# Patient Record
Sex: Female | Born: 1980 | Race: White | Hispanic: No | Marital: Married | Smoking: Former smoker
Health system: Southern US, Community
[De-identification: ages and names within clinical notes are randomized; demographics above are authoritative.]

## PROBLEM LIST (undated history)

## (undated) DIAGNOSIS — Z332 Encounter for elective termination of pregnancy: Secondary | ICD-10-CM

## (undated) DIAGNOSIS — R5383 Other fatigue: Secondary | ICD-10-CM

## (undated) DIAGNOSIS — R002 Palpitations: Secondary | ICD-10-CM

## (undated) DIAGNOSIS — M797 Fibromyalgia: Secondary | ICD-10-CM

## (undated) DIAGNOSIS — Z8719 Personal history of other diseases of the digestive system: Secondary | ICD-10-CM

## (undated) HISTORY — DX: Palpitations: R00.2

## (undated) HISTORY — PX: DILATION AND CURETTAGE OF UTERUS: SHX78

## (undated) HISTORY — PX: WISDOM TOOTH EXTRACTION: SHX21

## (undated) HISTORY — DX: Fibromyalgia: M79.7

## (undated) HISTORY — DX: Other fatigue: R53.83

---

## 2007-02-21 ENCOUNTER — Emergency Department (HOSPITAL_COMMUNITY): Admission: EM | Admit: 2007-02-21 | Discharge: 2007-02-21 | Payer: Self-pay | Admitting: Emergency Medicine

## 2008-01-08 ENCOUNTER — Emergency Department (HOSPITAL_COMMUNITY): Admission: EM | Admit: 2008-01-08 | Discharge: 2008-01-09 | Payer: Self-pay | Admitting: Emergency Medicine

## 2009-08-21 ENCOUNTER — Emergency Department (HOSPITAL_COMMUNITY): Admission: EM | Admit: 2009-08-21 | Discharge: 2009-08-21 | Payer: Self-pay | Admitting: Emergency Medicine

## 2009-11-06 IMAGING — US US PELVIS COMPLETE MODIFY
1 series · 14 of 25 positions shown · non-contrast
Comparison: None

CLINICAL DATA: Abdominal and pelvic pain. Gravida 1, para 0.
Negative urine pregnancy test. LMP 12/25/2007.

TRANSABDOMINAL AND TRANSVAGINAL ULTRASOUND OF PELVIS
TECHNIQUE: Both transabdominal and transvaginal ultrasound
examinations of the pelvis were performed including evaluation of
the uterus, ovaries, adnexal regions, and pelvic cul-de-sac.

[Series 1: unknown · 0.28mm/px · 14 of 55 slices shown]
[im 1/55]
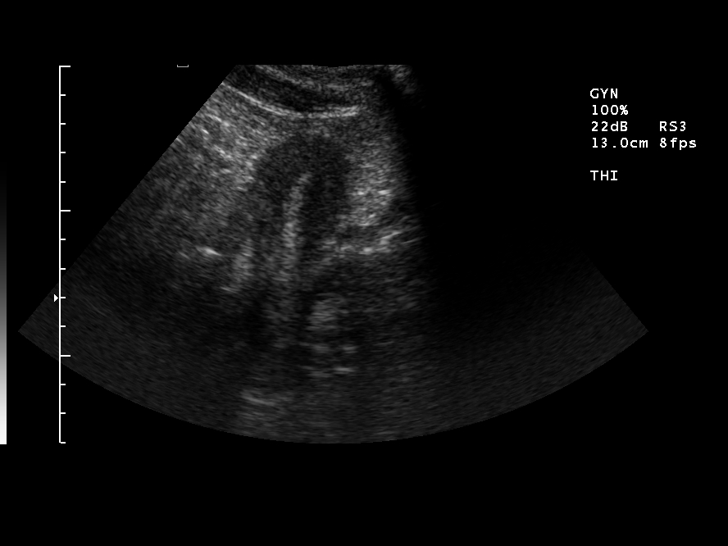
[im 5/55]
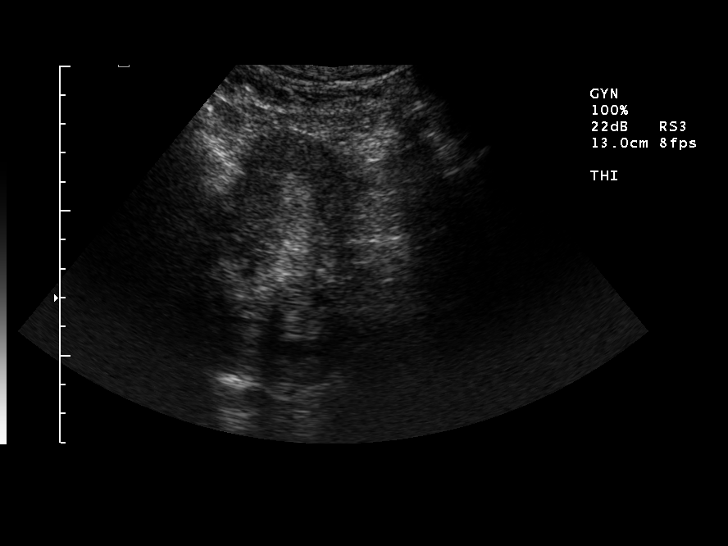
[im 10/55]
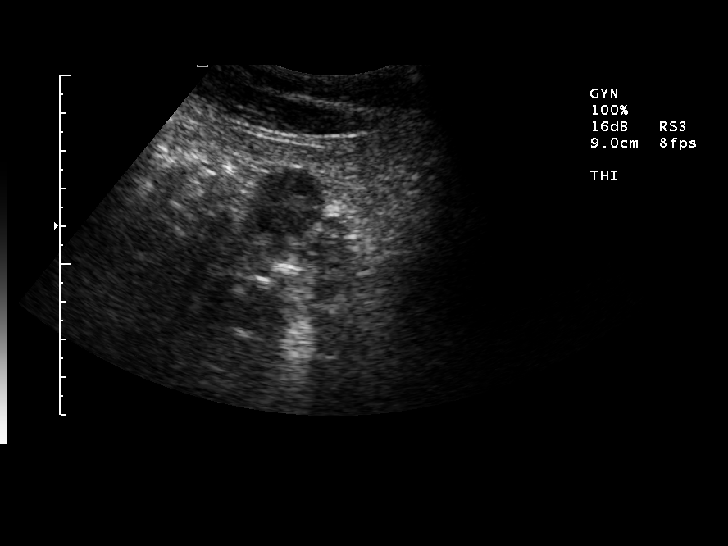
[im 14/55]
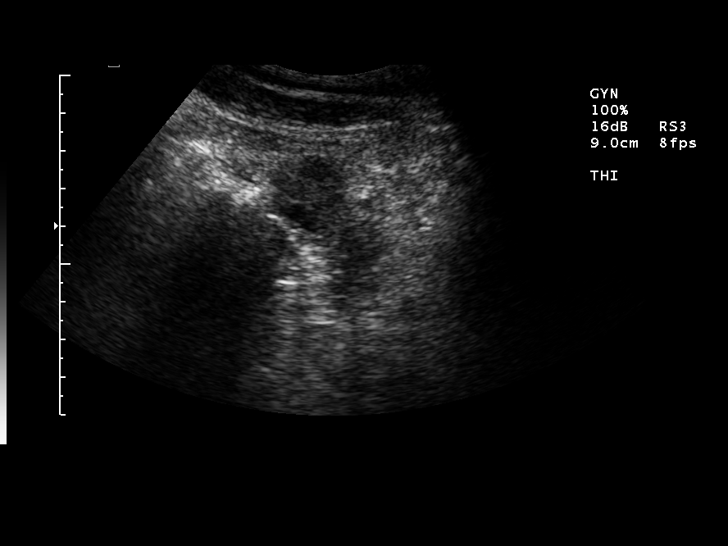
[im 19/55]
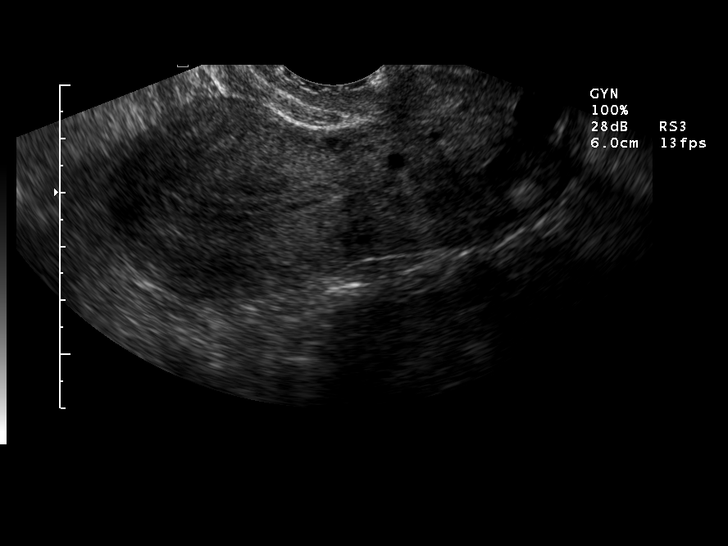
[im 21/55]
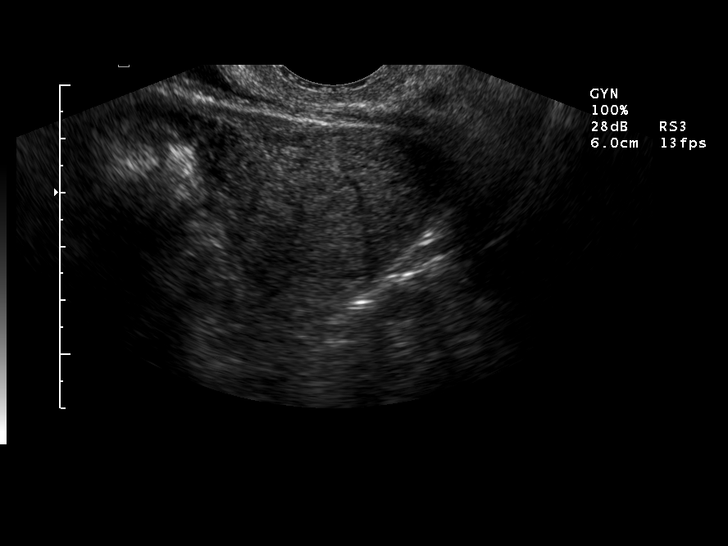
[im 25/55]
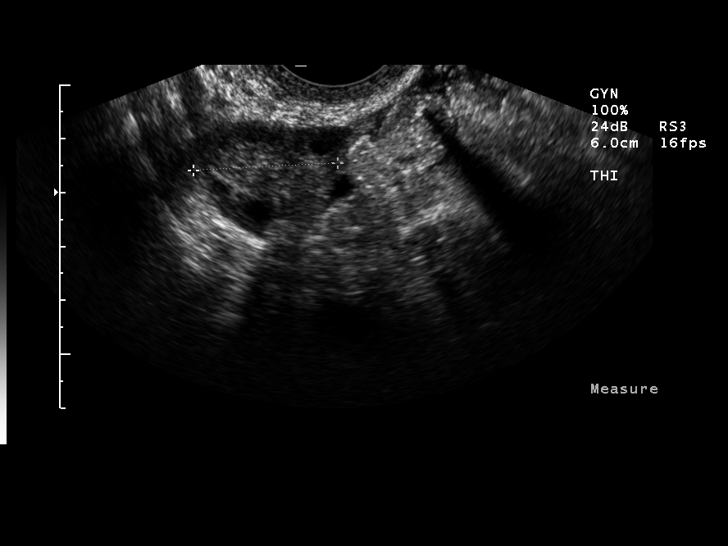
[im 30/55]
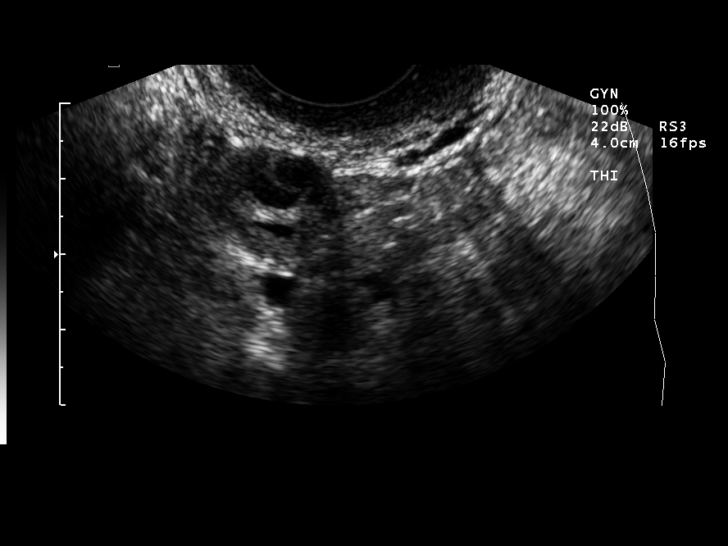
[im 34/55]
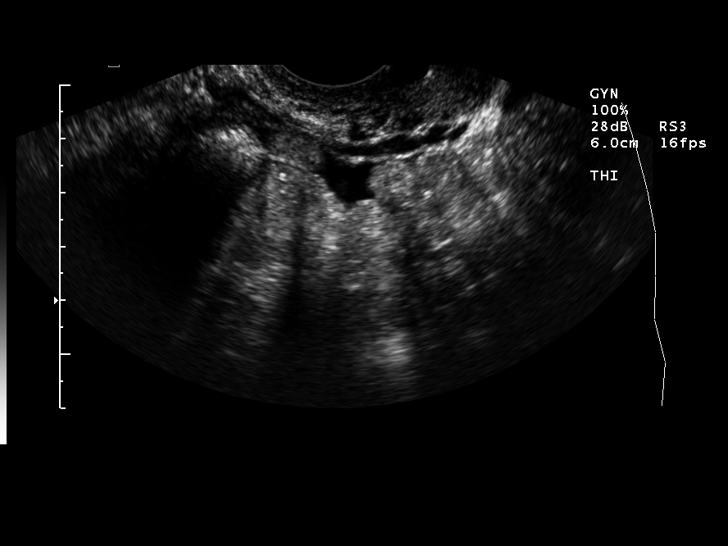
[im 37/55]
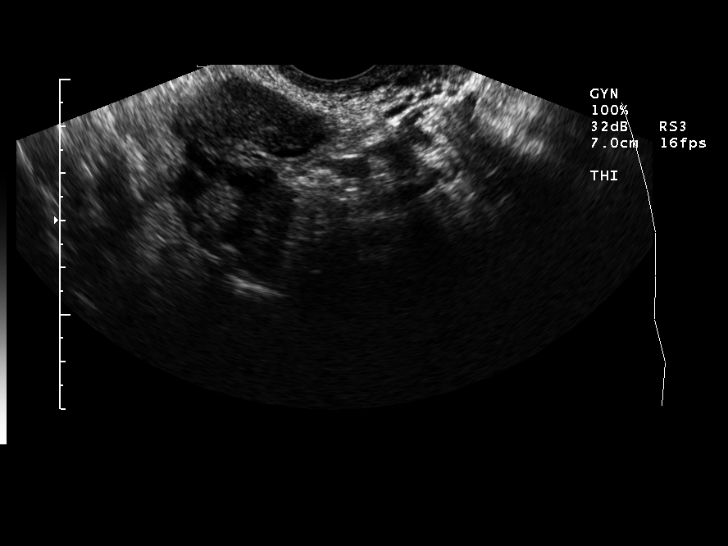
[im 41/55]
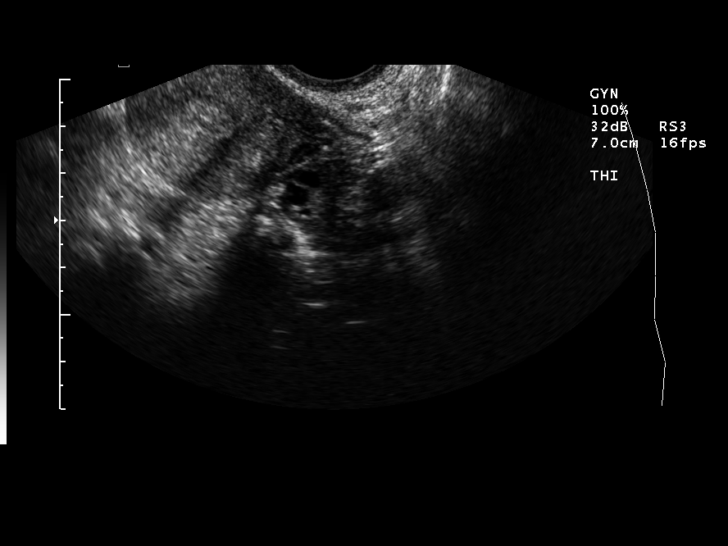
[im 46/55]
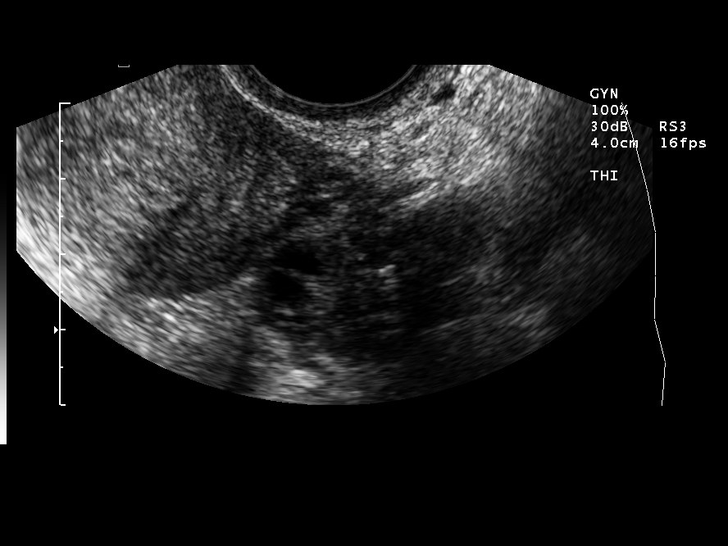
[im 50/55]
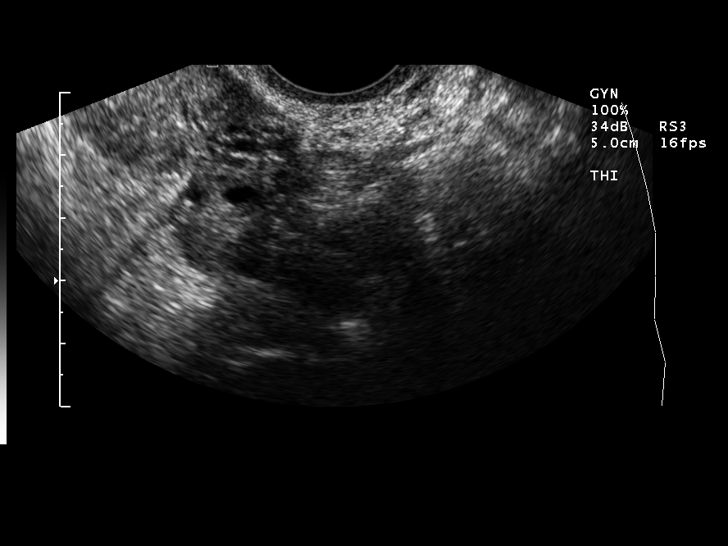
[im 55/55]
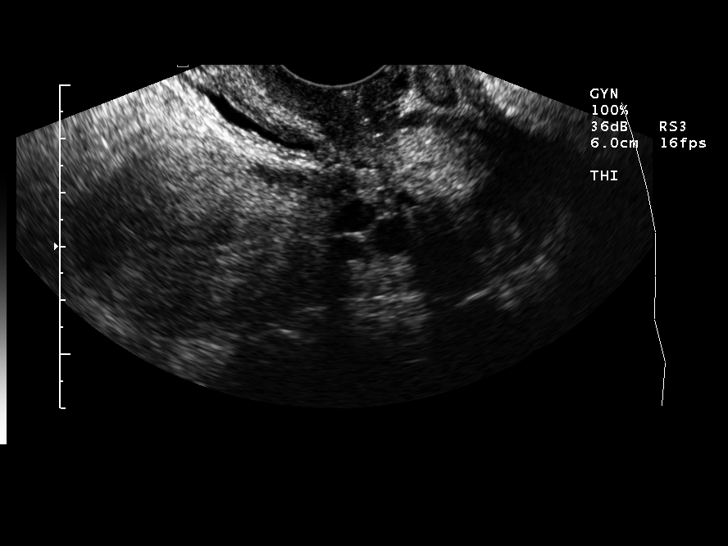

[14 of 25 positions shown; findings below may reference images not displayed]

FINDINGS: Uterus is 8.9 x 3.6 x 4.4 cm.  The endometrial stripe is
normal in appearance and measures 7 mm. Multiple Nabothian cysts
are identified.

Right ovary is 3.2 x 1.9 x 2.7 cm.  Left ovary is 3.2 x 2.5 x
cm.  The ovaries have normal appearance bilaterally.  Small amount
free pelvic fluid is identified, likely physiologic.
IMPRESSION: Normal pelvic ultrasound. I discussed findings with Dr. Barang.

## 2010-09-07 ENCOUNTER — Inpatient Hospital Stay (HOSPITAL_COMMUNITY)
Admission: AD | Admit: 2010-09-07 | Discharge: 2010-09-10 | DRG: 371 | Disposition: A | Discharge status: Home / Self Care | Payer: BC Managed Care – PPO | Source: Ambulatory Visit | Attending: Obstetrics and Gynecology | Admitting: Obstetrics and Gynecology

## 2010-09-07 DIAGNOSIS — O321XX0 Maternal care for breech presentation, not applicable or unspecified: Principal | ICD-10-CM | POA: Diagnosis present

## 2010-09-07 DIAGNOSIS — O4100X0 Oligohydramnios, unspecified trimester, not applicable or unspecified: Secondary | ICD-10-CM | POA: Diagnosis present

## 2010-09-07 LAB — ABO/RH: ABO/RH(D): O NEG

## 2010-09-07 LAB — CBC
MCHC: 35.1 g/dL (ref 30.0–36.0)
RBC: 4.11 MIL/uL (ref 3.87–5.11)

## 2010-09-08 LAB — CBC
Hemoglobin: 10.9 g/dL — ABNORMAL LOW (ref 12.0–15.0)
MCHC: 34.2 g/dL (ref 30.0–36.0)
Platelets: 150 10*3/uL (ref 150–400)
RBC: 3.42 MIL/uL — ABNORMAL LOW (ref 3.87–5.11)
RDW: 13.4 % (ref 11.5–15.5)

## 2010-09-09 ENCOUNTER — Encounter (HOSPITAL_COMMUNITY): Payer: BC Managed Care – PPO | Attending: Obstetrics and Gynecology

## 2010-09-09 DIAGNOSIS — Z01818 Encounter for other preprocedural examination: Secondary | ICD-10-CM | POA: Insufficient documentation

## 2010-09-09 LAB — RH IMMUNE GLOB WKUP(>/=20WKS)(NOT WOMEN'S HOSP): Unit division: 0

## 2010-09-12 NOTE — Discharge Summary (Addendum)
  Tina [name], Tina [name]              ACCOUNT NO.:  [id number]  MEDICAL RECORD NO.:  [id number]           PATIENT TYPE:  I  LOCATION:  9132                          [facility]ACILITY:  [facility]  PHYSICIAN:  Maxie Better, M.D.DATE OF BIRTH:  [birth year]  DATE OF ADMISSION:  09/06/2010 DATE OF DISCHARGE:  09/10/2010                              DISCHARGE SUMMARY   ADMISSION DIAGNOSES: 1. Term intrauterine pregnancy in frank breech position. 2. Oligohydramnios. 3. [name] breech presentation  DISCHARGE DIAGNOSIS:  Status post cesarean section and delivery of viable female infant. 2) [name] breech presentation 3) Oligohydramnios  The patient is a G2, P 0-0-1-0 with an EDC of September 17, 2010.  She has received prenatal care at [facility] since 11 weeks' gestation with Dr. Cherly Hensen as primary provider.  PRENATAL LABS:  Rh positive, rubella immune, GBS positive, HIV nonreactive, RPR nonreactive, hepatitis B nonreactive, 1-hour GTT 98. Prenatal course has essentially been uneventful.  ALLERGIES: 1. SHELLFISH. 2. PENICILLIN. 3. LATEX.  CURRENT MEDICATIONS:  Prenatal vitamin 1 tablet p.o. q. day.  PREOP LABS:  On September 07, 2010, WBC 14.1, hemoglobin 13.3, hematocrit 37.9, and platelets 168.  A viable female infant was delivered by C- section on September 07, 2010, by Dr. Cherly Hensen due to oligohydramnios and breech position.  Infant weighed 6 pounds 6 ounces with Apgars of 8 and 9, and was transferred to regular newborn nursery in stable condition. Postop labs on September 08, 2010, include WBC 15.7, hemoglobin 10.9, hematocrit 31.9, and platelets 150K.  Postop course has been uneventful and the patient has progressed well. Vital signs were stable.  Abdominal incision is closed with staples and appears clean, dry, and well approximated.  We will remove staples today and apply Benzoin and Steri-Strips prior to discharge.  Newborn female infant is successfully breast-feeding.  The patient is up ad  lib, tolerating a regular diet, and afebrile at the time of discharge.  She has been given the [facility] postpartum booklet.  PRESCRIPTIONS: 1. Ibuprofen 600 mg p.o. q.6 h. p.r.n. pain, dispensed #30, no refill. 2. Percocet 5/325 one to two p.o. q.6 h. p.r.n. pain, dispensed #30,     no refill.  The patient has been advised to follow up for routine postpartum visit in 6 weeks.     Arlana Lindau, NP   ______________________________ Maxie Better, M.D.    JF/MEDQ  D:  09/10/2010  T:  09/11/2010  Job:  [id number]  Electronically Signed by Nena Jordan COUSINS M.D. on 09/11/2010 02:05:08 AM Electronically Signed by Arlana Lindau  on 09/27/2010 08:38:48 AM

## 2010-09-12 NOTE — Op Note (Signed)
[name], [name]              ACCOUNT NO.:  [id number]  MEDICAL RECORD NO.:  [id number]           PATIENT TYPE:  I  LOCATION:  9132                          [facility]ILITY:  [facility]  PHYSICIAN:  Maxie Better, M.D.DATE OF BIRTH:  29-Mar-1981  DATE OF PROCEDURE:  09/07/2010 DATE OF DISCHARGE:                              OPERATIVE REPORT   PREOPERATIVE DIAGNOSES:  Oligohydramnios, frank breech presentation, and intrauterine gestation at 38-3/7 weeks.  PROCEDURE:  Primary cesarean section, Kerr hysterotomy.  POSTOPERATIVE DIAGNOSES:  [name] breech presentation, oligohydramnios, and intrauterine gestation at 38-3/7 weeks.  ANESTHESIA:  Spinal.  SURGEON:  Maxie Better, M.D.  ASSISTANT:  Marlinda Mike, CNM.  PROCEDURE:  Under adequate spinal anesthesia, the patient was placed in supine position with a left lateral tilt.  She was sterilely prepped and draped in usual fashion.  Indwelling Foley catheter was sterilely placed.  Marcaine 0.25% was injected along the planned Pfannenstiel skin incision site.  Pfannenstiel skin incision was then made, carried down to the rectus fascia.  The rectus fascia was opened transversely.  The rectus fascia was then bluntly and sharply dissected off the rectus muscle in superior and inferior fashion.  The rectus muscle was split in midline.  The parietal peritoneum was entered sharply and extended.  The vesicouterine peritoneum was opened transversely.  There were very prominent vessels in the lower uterine segment.  A curvilinear low transverse uterine incision was then made and extended with bandage scissors.  Artificial rupture of membranes subsequently occurred with clear fluid noted, but scant.  Subsequent delivery of a live female from a frank breech position was accomplished with the use of usual breech maneuvers.  Baby was bulb suctioned on the abdomen.  Cord was clamped and cut.  The baby was transferred to the awaiting pediatrician  who assigned Apgars of 8 and 9 at one and five minutes.  The placenta which was anterior was partially and spontaneously removed intact, but not sent to pathology.  Uterine cavity was visually explored and manually explored.  No evidence of intrauterine anomaly noted.  Uterus was exteriorized, inspected as well.  No uterine anomaly was seen.  Normal tubes and ovaries were noted bilaterally.  The uterine incision was cleaned of debris.  The incision was closed with 0-Monocryl running locked stitch, first layer.  Uterus was returned to the abdomen.  The second layer was imbricating 0-Monocryl suture.  Abdomen was then copiously irrigated and suctioned of debris.  Good hemostasis on the uterine incision was noted.  Parietal peritoneum was then closed with 2- 0 Vicryl.  The rectus fascia was closed with 0-Vicryl x2.  The subcutaneous area was irrigated, small bleeders cauterized.  Interrupted 2-0 plain sutures placed and the skin approximated using Ethicon staples.  SPECIMENS:  Placenta not sent to pathology.  ESTIMATED BLOOD LOSS:  700 mL.  INTRAOPERATIVE FLUID:  1800 mL.  URINE OUTPUT:  Clear yellow urine, 400 mL.  Sponge and instrument count x2 was correct.  COMPLICATIONS:  None.  Weight of the baby was 6 pounds 6 ounces.  The patient tolerated procedure well and was transferred to recovery room in stable condition.  Maxie Better, M.D.     [place]/MEDQ  D:  09/07/2010  T:  09/08/2010  Job:  [id number]  Electronically Signed by Nena Jordan Kameran Lallier M.D. on 09/11/2010 02:03:37 AM

## 2010-09-17 ENCOUNTER — Inpatient Hospital Stay (HOSPITAL_COMMUNITY): Admission: AD | Admit: 2010-09-17 | Payer: Self-pay | Source: Home / Self Care | Admitting: Obstetrics and Gynecology

## 2010-12-23 ENCOUNTER — Emergency Department (HOSPITAL_COMMUNITY)
Admission: EM | Admit: 2010-12-23 | Discharge: 2010-12-23 | Disposition: A | Discharge status: Home / Self Care | Payer: BC Managed Care – PPO | Attending: Emergency Medicine | Admitting: Emergency Medicine

## 2010-12-23 DIAGNOSIS — N390 Urinary tract infection, site not specified: Secondary | ICD-10-CM | POA: Insufficient documentation

## 2010-12-23 DIAGNOSIS — R300 Dysuria: Secondary | ICD-10-CM | POA: Insufficient documentation

## 2010-12-23 LAB — CBC
Hemoglobin: 12.8 g/dL (ref 12.0–15.0)
MCH: 30.8 pg (ref 26.0–34.0)
MCHC: 35.2 g/dL (ref 30.0–36.0)
RBC: 4.16 MIL/uL (ref 3.87–5.11)
WBC: 11 10*3/uL — ABNORMAL HIGH (ref 4.0–10.5)

## 2010-12-23 LAB — DIFFERENTIAL
Lymphs Abs: 1.8 10*3/uL (ref 0.7–4.0)
Monocytes Absolute: 0.8 10*3/uL (ref 0.1–1.0)
Neutrophils Relative %: 74 % (ref 43–77)

## 2010-12-23 LAB — URINE MICROSCOPIC-ADD ON

## 2010-12-23 LAB — COMPREHENSIVE METABOLIC PANEL
ALT: 12 U/L (ref 0–35)
AST: 15 U/L (ref 0–37)
Albumin: 3.9 g/dL (ref 3.5–5.2)
BUN: 9 mg/dL (ref 6–23)
Creatinine, Ser: 0.66 mg/dL (ref 0.50–1.10)
Glucose, Bld: 95 mg/dL (ref 70–99)

## 2010-12-23 LAB — URINALYSIS, ROUTINE W REFLEX MICROSCOPIC
Glucose, UA: NEGATIVE mg/dL
Ketones, ur: NEGATIVE mg/dL
Protein, ur: 100 mg/dL — AB
Specific Gravity, Urine: 1.02 (ref 1.005–1.030)
pH: 8 (ref 5.0–8.0)

## 2010-12-23 LAB — POCT PREGNANCY, URINE: Preg Test, Ur: NEGATIVE

## 2011-04-03 LAB — CBC
MCHC: 34.6
Platelets: 242
RBC: 4.33
WBC: 10.2

## 2011-04-03 LAB — BASIC METABOLIC PANEL
BUN: 7
CO2: 27
Calcium: 9
GFR calc non Af Amer: >60
Glucose, Bld: 118 — ABNORMAL HIGH

## 2011-04-03 LAB — URINALYSIS, ROUTINE W REFLEX MICROSCOPIC
Ketones, ur: NEGATIVE
Protein, ur: NEGATIVE
Specific Gravity, Urine: 1.015
Urobilinogen, UA: 1
pH: 7.5

## 2011-04-03 LAB — WET PREP, GENITAL
Trich, Wet Prep: NONE SEEN
Yeast Wet Prep HPF POC: NONE SEEN

## 2011-04-03 LAB — URINE CULTURE: Colony Count: >=100000

## 2011-04-03 LAB — GC/CHLAMYDIA PROBE AMP, GENITAL
Chlamydia, DNA Probe: NEGATIVE
GC Probe Amp, Genital: NEGATIVE

## 2011-04-03 LAB — DIFFERENTIAL
Eosinophils Absolute: 0.3
Eosinophils Relative: 3
Lymphocytes Relative: 24
Lymphs Abs: 2.5
Neutro Abs: 6.8
Neutrophils Relative %: 66

## 2011-04-03 LAB — URINE MICROSCOPIC-ADD ON

## 2011-06-19 IMAGING — CR DG CHEST 2V
2 series · 2 of 2 positions shown · non-contrast
Comparison: Chest x-ray of 02/21/2007

CLINICAL DATA: Fever, cough, congestion, smoking history

CHEST - 2 VIEW

[w chest pa]
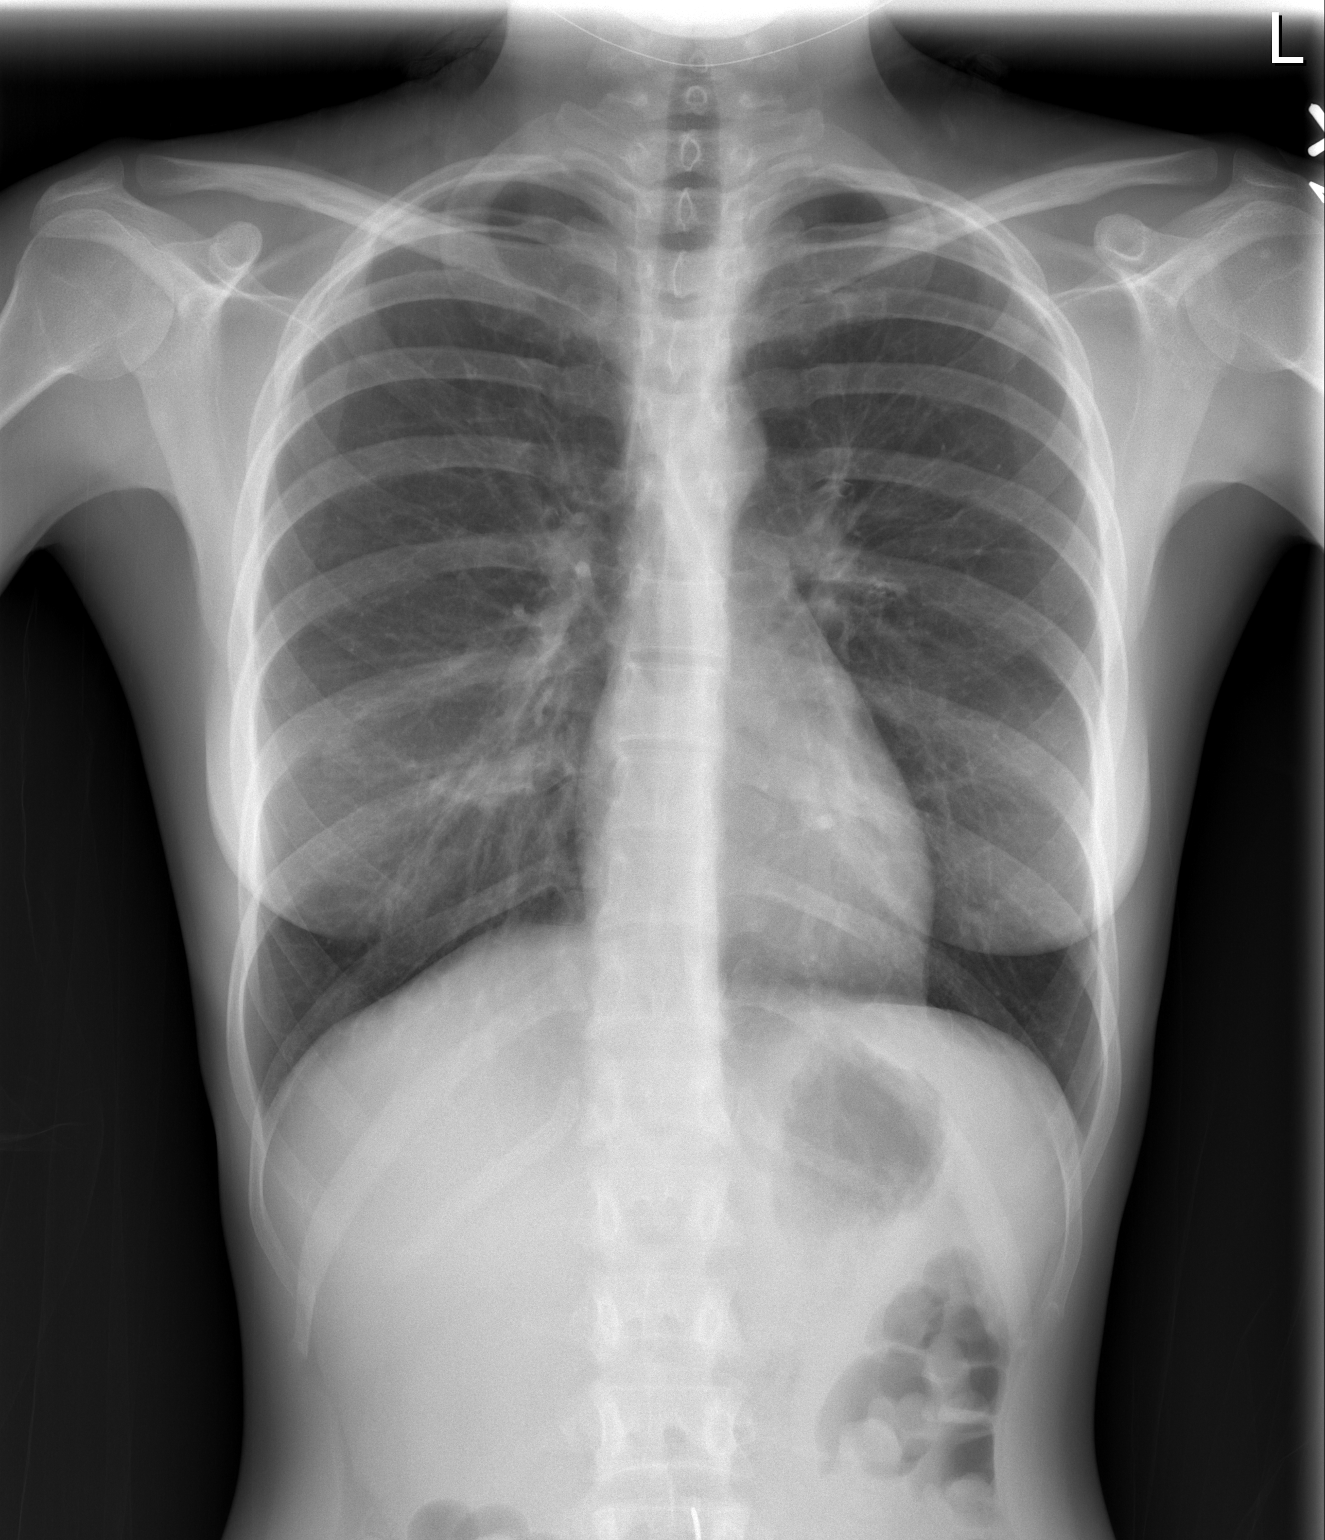

[w chest lat]
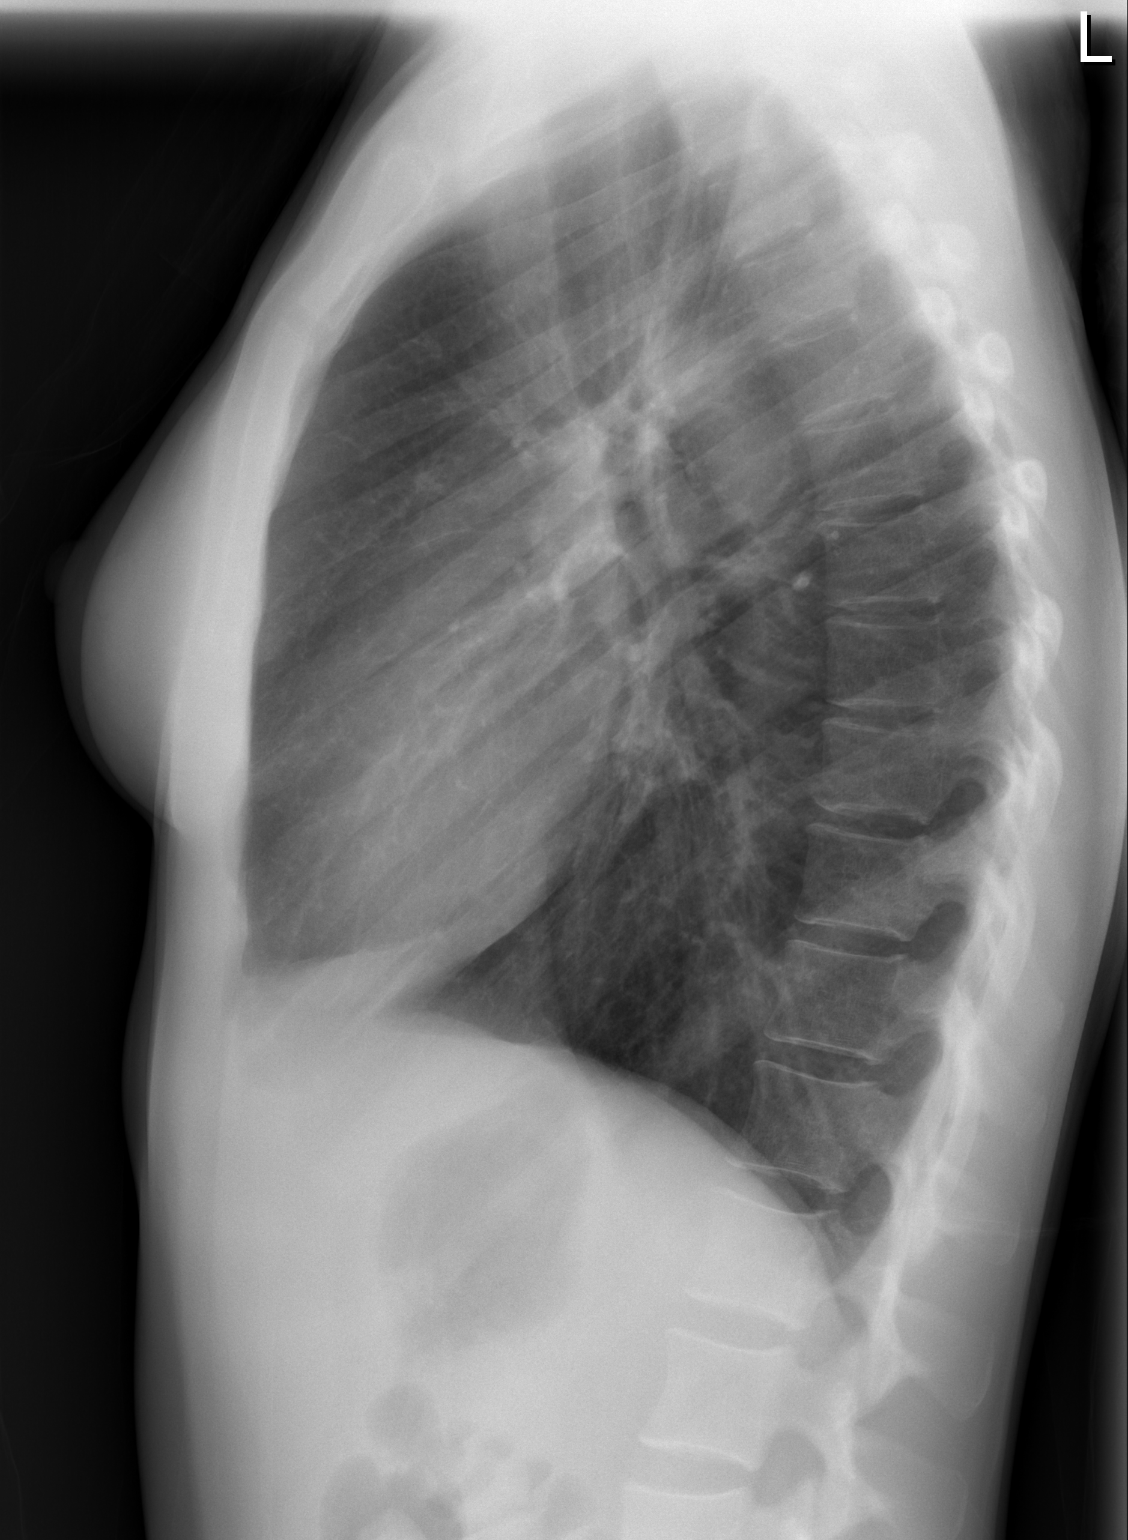

[2 of 2 positions shown; findings below may reference images not displayed]

FINDINGS: The lungs are hyperaerated . On the lateral view there is
a questionable small nodular opacity anteriorly.  This may simply
represent overlapping bony and vascular structures, but attention
to this area on follow-up chest x-ray is recommended.  There is
some peribronchial thickening which may indicate bronchitis.  No
adenopathy is seen.  The heart is within normal limits in size.  No
bony abnormality is noted.
IMPRESSION: 1.  Hyperaeration.  No infiltrate.
2.  Peribronchial thickening may indicate bronchitis.
3.  Questionable small nodular opacity anteriorly on the lateral
view.  Recommend follow-up chest x-ray to assess stability.

## 2013-10-26 LAB — OB RESULTS CONSOLE HEPATITIS B SURFACE ANTIGEN: Hepatitis B Surface Ag: NEGATIVE

## 2013-10-26 LAB — OB RESULTS CONSOLE ANTIBODY SCREEN: Antibody Screen: NEGATIVE

## 2013-10-26 LAB — OB RESULTS CONSOLE RUBELLA ANTIBODY, IGM: Rubella: IMMUNE

## 2013-10-26 LAB — OB RESULTS CONSOLE ABO/RH: RH TYPE: NEGATIVE

## 2013-10-26 LAB — OB RESULTS CONSOLE RPR: RPR: NONREACTIVE

## 2013-10-26 LAB — OB RESULTS CONSOLE HIV ANTIBODY (ROUTINE TESTING): HIV: NONREACTIVE

## 2014-03-01 ENCOUNTER — Inpatient Hospital Stay (HOSPITAL_COMMUNITY)
Admission: AD | Admit: 2014-03-01 | Discharge: 2014-03-02 | Disposition: A | Discharge status: Home / Self Care | Payer: BC Managed Care – PPO | Source: Ambulatory Visit | Attending: Obstetrics and Gynecology | Admitting: Obstetrics and Gynecology

## 2014-03-01 ENCOUNTER — Encounter (HOSPITAL_COMMUNITY): Payer: Self-pay | Admitting: *Deleted

## 2014-03-01 DIAGNOSIS — O9989 Other specified diseases and conditions complicating pregnancy, childbirth and the puerperium: Principal | ICD-10-CM

## 2014-03-01 DIAGNOSIS — R102 Pelvic and perineal pain: Secondary | ICD-10-CM

## 2014-03-01 DIAGNOSIS — N949 Unspecified condition associated with female genital organs and menstrual cycle: Secondary | ICD-10-CM | POA: Diagnosis not present

## 2014-03-01 DIAGNOSIS — K219 Gastro-esophageal reflux disease without esophagitis: Secondary | ICD-10-CM | POA: Diagnosis not present

## 2014-03-01 DIAGNOSIS — O99891 Other specified diseases and conditions complicating pregnancy: Secondary | ICD-10-CM | POA: Insufficient documentation

## 2014-03-01 DIAGNOSIS — O26893 Other specified pregnancy related conditions, third trimester: Secondary | ICD-10-CM

## 2014-03-01 DIAGNOSIS — R109 Unspecified abdominal pain: Secondary | ICD-10-CM | POA: Diagnosis present

## 2014-03-01 DIAGNOSIS — R51 Headache: Secondary | ICD-10-CM | POA: Insufficient documentation

## 2014-03-01 LAB — URINALYSIS, ROUTINE W REFLEX MICROSCOPIC
Bilirubin Urine: NEGATIVE
Glucose, UA: NEGATIVE mg/dL
Hgb urine dipstick: NEGATIVE
Ketones, ur: NEGATIVE mg/dL
Leukocytes, UA: NEGATIVE
NITRITE: NEGATIVE
PROTEIN: NEGATIVE mg/dL
Urobilinogen, UA: 0.2 mg/dL (ref 0.0–1.0)
pH: 8 (ref 5.0–8.0)

## 2014-03-01 NOTE — MAU Provider Note (Signed)
  History     CSN: [id number]  Arrival date and time: 03/01/14 2136 Call from nurse @ 2232 Provider here to evaluate patient @ 2305    Chief Complaint  Patient presents with  . Fatigue  . Abdominal Pain  . Headache   HPI Headache for few days - worse between meals - not effected by Tylenol or sleep States really worried about headache - possible PEC Persistent fatigue - no energy Some rapid heart rate sometimes  Difficulty taking deep breath for past 3 days - pelvic pressure No ctx / no bleeding / no LOF Active FM  Called office this pm with symptoms - unable to get to office prior to closing / apt in AM with Dr Juliene Pina Really worried and did not want to wait until morning to be seen  Past Medical History  Diagnosis Date  . Medical history non-contributory     Past Surgical History  Procedure Laterality Date  . Cesarean section      No family history on file.  History  Substance Use Topics  . Smoking status: Not on file  . Smokeless tobacco: Not on file  . Alcohol Use: Not on file    Allergies:  Allergies  Allergen Reactions  . Latex   . Shellfish-Derived Products   . Penicillins Rash    No prescriptions prior to admission    ROS Physical Exam   Blood pressure 120/69, pulse 110, resp. rate 20, height  (1.549 m), weight 60.328 kg (133 lb), SpO2 100.00%.  Physical Exam Alert and oriented - NAD Heart RRR - mild tachycardia at 110 Lungs - clear / O2 sat 100% on RA Abdomen soft and non-tender / uterus non-tender & gravid / FH 27 / Leopalds cephalic Extremities - no edema Spec exam - scant white discharge / cervix appears closed VE: closed / soft / no effacement / no LUSD  Urinalysis - normal MAU Course  Procedures  FHR 155 / moderate variability TOCO - no ctx or UI  Assessment and Plan  27.3 weeks pergnant  Pelvic pain without evidence of PTL - reviewed pelvic sensations different with consecutive pregnancies and with cephalic vs breech  presentations  Headache and dizziness likely related to glucose-insulin changes in pregnancy / persistent fatigue could be related to IDA or glucose changes as well No evidence of PEC or other neurological symptoms with headache Reports poor diet and nutrition this pregnancy with GERD / reflux requiring daily medication Recommend fasting glucose and TSH tomorrow - GTT scheduled for next week with sono-growth  DC home Rest tonight Fasting labs in AM at [facility] - keep visit with Dr Juliene Pina as scheduled Maintain good water hydration Increase protein intake daily - avoid skipping meals    Derius Ghosh 03/01/2014, 11:07 PM

## 2014-03-01 NOTE — Discharge Instructions (Signed)
Abdominal Pain During Pregnancy Belly (abdominal) pain is common during pregnancy. Pregnancy is an uncomfortable state - the position of the baby, the growing uterus, the changes in your body to support the weight of pregnancy will all cause discomfort. It is important to know the difference between discomfort and pain - being uncomfortable in pregnancy (especially in the last few weeks of pregnancy) is common, BUT pain is not normal. All PAIN needs to be evaluated.  Most of the time, it is not a serious problem. Other times, it can be a sign that something is wrong with the pregnancy.  Always call your doctor if you have belly/ pelvic PAIN. HOME CARE Monitor your belly pain for any changes. The following actions may help you feel better:  Rest until your pain stops.  Drink clear fluids if you feel sick to your stomach (nauseous).   Keep all doctor visits as scheduled. Call your doctor's office IF:   You are bleeding or leaking fluid.  You have more pain or cramping - worsening pain.  You keep throwing up (vomiting) - unable to keep fluids down for more than 4 hours.  You have pain when you pee (urinate) or have blood in your pee.  You have a fever over 100.61F.  You do not feel your baby moving.  You pass out.  You have trouble breathing - unable to talk in order to breathe / pain with breathing.  You have a very bad headache.  You keep having watery poop (diarrhea).  Your belly pain does not go away after resting, or the pain continues to get worse. MAKE SURE YOU:   Understand these instructions.  Will watch your condition.  Will get help right away if you are not doing well or get worse.   Document Released: 06/11/2009 Document Revised: 02/23/2013 Document Reviewed: 01/20/2013 [facility] Patient Information 2015 [place], [place]. This information is not intended to replace advice given to you by your health care provider. Make sure you discuss any questions you have with  your health care provider.

## 2014-03-01 NOTE — MAU Note (Signed)
Pt reports lower abd pressure x 3 days ago, makes her feel like she can't get her breath. Fatigue.

## 2014-03-01 NOTE — MAU Note (Signed)
Pt complaining of pressure that has been steady for a few days. Pt states pressure has been occasional prior. Pt states she has had a hard time catching her breathe. Pt states she also has a headache. Pt states she has also feels like she has to hold her abdomen up

## 2014-03-01 NOTE — Progress Notes (Signed)
Pt states she has a headache that she has had all day. Pt states she has not been able to break it

## 2014-05-01 ENCOUNTER — Other Ambulatory Visit: Payer: Self-pay | Admitting: Obstetrics and Gynecology

## 2014-05-08 ENCOUNTER — Encounter (HOSPITAL_COMMUNITY): Payer: Self-pay | Admitting: *Deleted

## 2014-05-18 ENCOUNTER — Encounter (HOSPITAL_COMMUNITY): Payer: Self-pay

## 2014-05-18 NOTE — Patient Instructions (Addendum)
   Your procedure is scheduled on:  Monday, Nov 16  [facility] at: 12 Noon Pick up the phone at the desk and dial [phone] and inform us of your arrival.  Please call this number if you have any problems the morning of surgery: [phone]  Remember: Do not eat food after midnight: Sunday Do not drink clear liquids after: 9:30 AM Monday, day of surgery Take these medicines the morning of surgery with a SIP OF WATER:  pepcid  Do not wear jewelry, make-up, or FINGER nail polish No metal in your hair or on your body. Do not wear lotions, powders, perfumes.  You may wear deodorant.  Do not bring valuables to the hospital. Contacts, dentures or bridgework may not be worn into surgery.  Leave suitcase in the car. After Surgery it may be brought to your room. For patients being admitted to the hospital, checkout time is 11:00am the day of discharge.  Home with fiance Micah Capaldi cell [phone].

## 2014-05-19 ENCOUNTER — Encounter (HOSPITAL_COMMUNITY)
Admission: RE | Admit: 2014-05-19 | Discharge: 2014-05-19 | Disposition: A | Discharge status: Home / Self Care | Payer: BC Managed Care – PPO | Source: Ambulatory Visit | Attending: Obstetrics and Gynecology | Admitting: Obstetrics and Gynecology

## 2014-05-19 ENCOUNTER — Encounter (HOSPITAL_COMMUNITY): Payer: Self-pay

## 2014-05-19 HISTORY — DX: Encounter for elective termination of pregnancy: Z33.2

## 2014-05-19 HISTORY — DX: Personal history of other diseases of the digestive system: Z87.19

## 2014-05-19 LAB — TYPE AND SCREEN
ABO/RH(D): O NEG
ANTIBODY SCREEN: NEGATIVE

## 2014-05-19 LAB — CBC
HCT: 35.5 % — ABNORMAL LOW (ref 36.0–46.0)
HEMOGLOBIN: 12.1 g/dL (ref 12.0–15.0)
MCH: 30.9 pg (ref 26.0–34.0)
MCHC: 34.1 g/dL (ref 30.0–36.0)
MCV: 90.6 fL (ref 78.0–100.0)
Platelets: 170 10*3/uL (ref 150–400)
RBC: 3.92 MIL/uL (ref 3.87–5.11)
RDW: 13.5 % (ref 11.5–15.5)
WBC: 10.2 10*3/uL (ref 4.0–10.5)

## 2014-05-19 LAB — RPR

## 2014-05-21 MED ORDER — GENTAMICIN SULFATE 40 MG/ML IJ SOLN
INTRAVENOUS | Status: AC
  Administered 2014-05-22: 112.75 mL via INTRAVENOUS
  Filled 2014-05-21: qty 6.75

## 2014-05-22 ENCOUNTER — Inpatient Hospital Stay (HOSPITAL_COMMUNITY): Payer: BC Managed Care – PPO | Admitting: Anesthesiology

## 2014-05-22 ENCOUNTER — Encounter (HOSPITAL_COMMUNITY): Payer: Self-pay | Admitting: *Deleted

## 2014-05-22 ENCOUNTER — Inpatient Hospital Stay (HOSPITAL_COMMUNITY)
Admission: RE | Admit: 2014-05-22 | Discharge: 2014-05-25 | DRG: 765 | Disposition: A | Discharge status: Home / Self Care | Payer: BC Managed Care – PPO | Source: Ambulatory Visit | Attending: Obstetrics and Gynecology | Admitting: Obstetrics and Gynecology

## 2014-05-22 ENCOUNTER — Encounter (HOSPITAL_COMMUNITY)
Admission: RE | Disposition: A | Discharge status: Home / Self Care | Payer: Self-pay | Source: Ambulatory Visit | Attending: Obstetrics and Gynecology

## 2014-05-22 DIAGNOSIS — D62 Acute posthemorrhagic anemia: Secondary | ICD-10-CM | POA: Diagnosis present

## 2014-05-22 DIAGNOSIS — Z87891 Personal history of nicotine dependence: Secondary | ICD-10-CM | POA: Diagnosis not present

## 2014-05-22 DIAGNOSIS — Z3A39 39 weeks gestation of pregnancy: Secondary | ICD-10-CM | POA: Diagnosis present

## 2014-05-22 DIAGNOSIS — Z302 Encounter for sterilization: Secondary | ICD-10-CM

## 2014-05-22 DIAGNOSIS — O9902 Anemia complicating childbirth: Secondary | ICD-10-CM | POA: Diagnosis present

## 2014-05-22 DIAGNOSIS — O3421 Maternal care for scar from previous cesarean delivery: Principal | ICD-10-CM | POA: Diagnosis present

## 2014-05-22 DIAGNOSIS — O360930 Maternal care for other rhesus isoimmunization, third trimester, not applicable or unspecified: Secondary | ICD-10-CM | POA: Diagnosis present

## 2014-05-22 SURGERY — Surgical Case
Anesthesia: Spinal | Site: Abdomen

## 2014-05-22 SURGERY — Surgical Case
Anesthesia: Regional | Laterality: Bilateral

## 2014-05-22 MED ORDER — SODIUM CHLORIDE 0.9 % IJ SOLN: 3.0000 mL | INTRAMUSCULAR | Status: DC | PRN

## 2014-05-22 MED ORDER — ONDANSETRON HCL 4 MG/2ML IJ SOLN
INTRAMUSCULAR | Status: AC
  Filled 2014-05-22: qty 2

## 2014-05-22 MED ORDER — KETOROLAC TROMETHAMINE 30 MG/ML IJ SOLN
INTRAMUSCULAR | Status: AC
  Filled 2014-05-22: qty 1

## 2014-05-22 MED ORDER — PHENYLEPHRINE HCL 10 MG/ML IJ SOLN
INTRAMUSCULAR | Status: AC
  Filled 2014-05-22: qty 1

## 2014-05-22 MED ORDER — ONDANSETRON HCL 4 MG/2ML IJ SOLN: 4.0000 mg | Freq: Three times a day (TID) | INTRAMUSCULAR | Status: DC | PRN

## 2014-05-22 MED ORDER — NALBUPHINE HCL 10 MG/ML IJ SOLN: 5.0000 mg | INTRAMUSCULAR | Status: DC | PRN

## 2014-05-22 MED ORDER — IBUPROFEN 600 MG PO TABS: 600.0000 mg | ORAL_TABLET | Freq: Four times a day (QID) | ORAL | Status: DC | PRN

## 2014-05-22 MED ORDER — SCOPOLAMINE 1 MG/3DAYS TD PT72: 1.0000 | MEDICATED_PATCH | Freq: Once | TRANSDERMAL | Status: DC

## 2014-05-22 MED ORDER — DIPHENHYDRAMINE HCL 25 MG PO CAPS: 25.0000 mg | ORAL_CAPSULE | Freq: Four times a day (QID) | ORAL | Status: DC | PRN

## 2014-05-22 MED ORDER — FENTANYL CITRATE 0.05 MG/ML IJ SOLN
INTRAMUSCULAR | Status: DC | PRN
  Administered 2014-05-22: 25 ug via INTRATHECAL

## 2014-05-22 MED ORDER — LACTATED RINGERS IV SOLN
INTRAVENOUS | Status: DC | PRN
  Administered 2014-05-22: 14:00:00 via INTRAVENOUS

## 2014-05-22 MED ORDER — DEXTROSE IN LACTATED RINGERS 5 % IV SOLN
INTRAVENOUS | Status: DC
  Administered 2014-05-23: 900 mL via INTRAVENOUS

## 2014-05-22 MED ORDER — IBUPROFEN 600 MG PO TABS
600.0000 mg | ORAL_TABLET | Freq: Four times a day (QID) | ORAL | Status: DC
  Administered 2014-05-23 – 2014-05-25 (×10): 600 mg via ORAL
  Filled 2014-05-22 (×10): qty 1

## 2014-05-22 MED ORDER — EPHEDRINE 5 MG/ML INJ
INTRAVENOUS | Status: AC
  Filled 2014-05-22: qty 10

## 2014-05-22 MED ORDER — SIMETHICONE 80 MG PO CHEW
80.0000 mg | CHEWABLE_TABLET | Freq: Three times a day (TID) | ORAL | Status: DC
  Administered 2014-05-23 – 2014-05-25 (×6): 80 mg via ORAL
  Filled 2014-05-22 (×6): qty 1

## 2014-05-22 MED ORDER — OXYCODONE-ACETAMINOPHEN 5-325 MG PO TABS
2.0000 | ORAL_TABLET | ORAL | Status: DC | PRN
  Administered 2014-05-23 – 2014-05-25 (×8): 2 via ORAL
  Filled 2014-05-22 (×8): qty 2

## 2014-05-22 MED ORDER — FERROUS SULFATE 325 (65 FE) MG PO TABS
325.0000 mg | ORAL_TABLET | Freq: Two times a day (BID) | ORAL | Status: DC
  Administered 2014-05-23 – 2014-05-25 (×4): 325 mg via ORAL
  Filled 2014-05-22 (×4): qty 1

## 2014-05-22 MED ORDER — ONDANSETRON HCL 4 MG/2ML IJ SOLN
INTRAMUSCULAR | Status: DC | PRN
  Administered 2014-05-22: 4 mg via INTRAVENOUS

## 2014-05-22 MED ORDER — SIMETHICONE 80 MG PO CHEW: 80.0000 mg | CHEWABLE_TABLET | ORAL | Status: DC | PRN

## 2014-05-22 MED ORDER — FENTANYL CITRATE 0.05 MG/ML IJ SOLN
INTRAMUSCULAR | Status: AC
  Filled 2014-05-22: qty 2

## 2014-05-22 MED ORDER — DIBUCAINE 1 % RE OINT: 1.0000 "application " | TOPICAL_OINTMENT | RECTAL | Status: DC | PRN

## 2014-05-22 MED ORDER — MEPERIDINE HCL 25 MG/ML IJ SOLN
6.2500 mg | INTRAMUSCULAR | Status: DC | PRN
  Administered 2014-05-22: 12.5 mg via INTRAVENOUS

## 2014-05-22 MED ORDER — CEFAZOLIN SODIUM-DEXTROSE 2-3 GM-% IV SOLR
INTRAVENOUS | Status: AC
  Filled 2014-05-22: qty 50

## 2014-05-22 MED ORDER — SCOPOLAMINE 1 MG/3DAYS TD PT72
MEDICATED_PATCH | TRANSDERMAL | Status: AC
  Filled 2014-05-22: qty 1

## 2014-05-22 MED ORDER — EPHEDRINE SULFATE 50 MG/ML IJ SOLN
INTRAMUSCULAR | Status: DC | PRN
  Administered 2014-05-22: 15 mg via INTRAVENOUS

## 2014-05-22 MED ORDER — ONDANSETRON HCL 4 MG PO TABS: 4.0000 mg | ORAL_TABLET | ORAL | Status: DC | PRN

## 2014-05-22 MED ORDER — NALBUPHINE HCL 10 MG/ML IJ SOLN: 5.0000 mg | Freq: Once | INTRAMUSCULAR | Status: AC | PRN

## 2014-05-22 MED ORDER — PHENYLEPHRINE 8 MG IN D5W 100 ML (0.08MG/ML) PREMIX OPTIME
INJECTION | INTRAVENOUS | Status: DC | PRN
  Administered 2014-05-22: 60 ug/min via INTRAVENOUS

## 2014-05-22 MED ORDER — NALOXONE HCL 0.4 MG/ML IJ SOLN: 0.4000 mg | INTRAMUSCULAR | Status: DC | PRN

## 2014-05-22 MED ORDER — FLEET ENEMA 7-19 GM/118ML RE ENEM: 1.0000 | ENEMA | Freq: Every day | RECTAL | Status: DC | PRN

## 2014-05-22 MED ORDER — OXYTOCIN 10 UNIT/ML IJ SOLN
INTRAMUSCULAR | Status: AC
  Filled 2014-05-22: qty 4

## 2014-05-22 MED ORDER — ZOLPIDEM TARTRATE 5 MG PO TABS: 5.0000 mg | ORAL_TABLET | Freq: Every evening | ORAL | Status: DC | PRN

## 2014-05-22 MED ORDER — ACETAMINOPHEN 500 MG PO TABS
1000.0000 mg | ORAL_TABLET | Freq: Four times a day (QID) | ORAL | Status: AC
  Administered 2014-05-23 (×2): 1000 mg via ORAL
  Filled 2014-05-22 (×2): qty 2

## 2014-05-22 MED ORDER — PHENYLEPHRINE HCL 10 MG/ML IJ SOLN: INTRAMUSCULAR | Status: DC | PRN

## 2014-05-22 MED ORDER — KETOROLAC TROMETHAMINE 30 MG/ML IJ SOLN: 15.0000 mg | Freq: Once | INTRAMUSCULAR | Status: DC | PRN

## 2014-05-22 MED ORDER — BUPIVACAINE HCL (PF) 0.25 % IJ SOLN
INTRAMUSCULAR | Status: AC
Start: 2014-05-22 — End: 2014-05-22
  Filled 2014-05-22: qty 30

## 2014-05-22 MED ORDER — OXYTOCIN 40 UNITS IN LACTATED RINGERS INFUSION - SIMPLE MED: 62.5000 mL/h | INTRAVENOUS | Status: AC

## 2014-05-22 MED ORDER — SENNOSIDES-DOCUSATE SODIUM 8.6-50 MG PO TABS
2.0000 | ORAL_TABLET | ORAL | Status: DC
  Administered 2014-05-23 – 2014-05-24 (×3): 2 via ORAL
  Filled 2014-05-22 (×3): qty 2

## 2014-05-22 MED ORDER — MORPHINE SULFATE 0.5 MG/ML IJ SOLN
INTRAMUSCULAR | Status: AC
  Filled 2014-05-22: qty 10

## 2014-05-22 MED ORDER — LACTATED RINGERS IV SOLN
INTRAVENOUS | Status: DC
  Administered 2014-05-22 (×2): via INTRAVENOUS

## 2014-05-22 MED ORDER — SIMETHICONE 80 MG PO CHEW
80.0000 mg | CHEWABLE_TABLET | ORAL | Status: DC
  Administered 2014-05-23 – 2014-05-24 (×3): 80 mg via ORAL
  Filled 2014-05-22 (×3): qty 1

## 2014-05-22 MED ORDER — LANOLIN HYDROUS EX OINT: 1.0000 "application " | TOPICAL_OINTMENT | CUTANEOUS | Status: DC | PRN

## 2014-05-22 MED ORDER — WITCH HAZEL-GLYCERIN EX PADS: 1.0000 "application " | MEDICATED_PAD | CUTANEOUS | Status: DC | PRN

## 2014-05-22 MED ORDER — METHYLERGONOVINE MALEATE 0.2 MG/ML IJ SOLN: 0.2000 mg | INTRAMUSCULAR | Status: DC | PRN

## 2014-05-22 MED ORDER — BISACODYL 10 MG RE SUPP: 10.0000 mg | Freq: Every day | RECTAL | Status: DC | PRN

## 2014-05-22 MED ORDER — SCOPOLAMINE 1 MG/3DAYS TD PT72
1.0000 | MEDICATED_PATCH | Freq: Once | TRANSDERMAL | Status: DC
  Administered 2014-05-22: 1.5 mg via TRANSDERMAL

## 2014-05-22 MED ORDER — SODIUM BICARBONATE 8.4 % IV SOLN
INTRAVENOUS | Status: DC | PRN
  Administered 2014-05-22 (×2): 5 mL via EPIDURAL

## 2014-05-22 MED ORDER — MORPHINE SULFATE (PF) 0.5 MG/ML IJ SOLN
INTRAMUSCULAR | Status: DC | PRN
  Administered 2014-05-22: .15 mg via INTRATHECAL

## 2014-05-22 MED ORDER — MIDAZOLAM HCL 2 MG/2ML IJ SOLN: 0.5000 mg | Freq: Once | INTRAMUSCULAR | Status: DC | PRN

## 2014-05-22 MED ORDER — OXYCODONE-ACETAMINOPHEN 5-325 MG PO TABS
1.0000 | ORAL_TABLET | ORAL | Status: DC | PRN
Start: 2014-05-22 — End: 2014-05-25
  Administered 2014-05-23: 1 via ORAL
  Filled 2014-05-22: qty 1

## 2014-05-22 MED ORDER — KETOROLAC TROMETHAMINE 30 MG/ML IJ SOLN
30.0000 mg | Freq: Four times a day (QID) | INTRAMUSCULAR | Status: AC | PRN
  Administered 2014-05-22: 30 mg via INTRAMUSCULAR

## 2014-05-22 MED ORDER — KETOROLAC TROMETHAMINE 30 MG/ML IJ SOLN: 30.0000 mg | Freq: Four times a day (QID) | INTRAMUSCULAR | Status: AC | PRN

## 2014-05-22 MED ORDER — FENTANYL CITRATE 0.05 MG/ML IJ SOLN: 25.0000 ug | INTRAMUSCULAR | Status: DC | PRN

## 2014-05-22 MED ORDER — SODIUM CHLORIDE 0.9 % IJ SOLN: 3.0000 mL | Freq: Two times a day (BID) | INTRAMUSCULAR | Status: DC

## 2014-05-22 MED ORDER — DIPHENHYDRAMINE HCL 25 MG PO CAPS
25.0000 mg | ORAL_CAPSULE | ORAL | Status: DC | PRN
  Filled 2014-05-22: qty 1

## 2014-05-22 MED ORDER — PRENATAL MULTIVITAMIN CH
1.0000 | ORAL_TABLET | Freq: Every day | ORAL | Status: DC
  Administered 2014-05-23: 1 via ORAL
  Filled 2014-05-22 (×2): qty 1

## 2014-05-22 MED ORDER — BUPIVACAINE HCL (PF) 0.25 % IJ SOLN
INTRAMUSCULAR | Status: DC | PRN
  Administered 2014-05-22: 10 mL

## 2014-05-22 MED ORDER — MEPERIDINE HCL 25 MG/ML IJ SOLN
INTRAMUSCULAR | Status: AC
  Filled 2014-05-22: qty 1

## 2014-05-22 MED ORDER — MEPERIDINE HCL 25 MG/ML IJ SOLN: 6.2500 mg | INTRAMUSCULAR | Status: DC | PRN

## 2014-05-22 MED ORDER — MENTHOL 3 MG MT LOZG: 1.0000 | LOZENGE | OROMUCOSAL | Status: DC | PRN

## 2014-05-22 MED ORDER — OXYTOCIN 10 UNIT/ML IJ SOLN
40.0000 [IU] | INTRAVENOUS | Status: DC | PRN
  Administered 2014-05-22: 40 [IU] via INTRAVENOUS

## 2014-05-22 MED ORDER — PROMETHAZINE HCL 25 MG/ML IJ SOLN: 6.2500 mg | INTRAMUSCULAR | Status: DC | PRN

## 2014-05-22 MED ORDER — METHYLERGONOVINE MALEATE 0.2 MG PO TABS: 0.2000 mg | ORAL_TABLET | ORAL | Status: DC | PRN

## 2014-05-22 MED ORDER — FENTANYL CITRATE 0.05 MG/ML IJ SOLN
INTRAMUSCULAR | Status: AC
Start: 2014-05-22 — End: 2014-05-22
  Filled 2014-05-22: qty 2

## 2014-05-22 MED ORDER — SODIUM CHLORIDE 0.9 % IV SOLN: 250.0000 mL | INTRAVENOUS | Status: DC

## 2014-05-22 MED ORDER — DIPHENHYDRAMINE HCL 50 MG/ML IJ SOLN
12.5000 mg | INTRAMUSCULAR | Status: DC | PRN
Start: 2014-05-22 — End: 2014-05-22

## 2014-05-22 MED ORDER — BUPIVACAINE IN DEXTROSE 0.75-8.25 % IT SOLN
INTRATHECAL | Status: DC | PRN
  Administered 2014-05-22: 11 mg via INTRATHECAL

## 2014-05-22 MED ORDER — NALOXONE HCL 1 MG/ML IJ SOLN: 1.0000 ug/kg/h | INTRAVENOUS | Status: DC | PRN

## 2014-05-22 MED ORDER — ONDANSETRON HCL 4 MG/2ML IJ SOLN: 4.0000 mg | INTRAMUSCULAR | Status: DC | PRN

## 2014-05-22 SURGICAL SUPPLY — 45 items
APL SKNCLS STERI-STRIP NONHPOA (GAUZE/BANDAGES/DRESSINGS)
BARRIER ADHS 3X4 INTERCEED (GAUZE/BANDAGES/DRESSINGS) ×3 IMPLANT
BENZOIN TINCTURE PRP APPL 2/3 (GAUZE/BANDAGES/DRESSINGS) IMPLANT
BRR ADH 4X3 ABS CNTRL BYND (GAUZE/BANDAGES/DRESSINGS) ×1
CLAMP CORD UMBIL (MISCELLANEOUS) ×2 IMPLANT
CLOSURE WOUND 1/2 X4 (GAUZE/BANDAGES/DRESSINGS)
CLOTH BEACON ORANGE TIMEOUT ST (SAFETY) ×3 IMPLANT
CONTAINER PREFILL 10% NBF 15ML (MISCELLANEOUS) ×4 IMPLANT
DRAPE SHEET LG 3/4 BI-LAMINATE (DRAPES) ×2 IMPLANT
DRSG OPSITE POSTOP 4X10 (GAUZE/BANDAGES/DRESSINGS) ×3 IMPLANT
DURAPREP 26ML APPLICATOR (WOUND CARE) ×3 IMPLANT
ELECT REM PT RETURN 9FT ADLT (ELECTROSURGICAL) ×3
ELECTRODE REM PT RTRN 9FT ADLT (ELECTROSURGICAL) ×1 IMPLANT
EXTRACTOR VACUUM M CUP 4 TUBE (SUCTIONS) IMPLANT
EXTRACTOR VACUUM M CUP 4' TUBE (SUCTIONS)
GLOVE BIOGEL PI IND STRL 7.0 (GLOVE) ×1 IMPLANT
GLOVE BIOGEL PI INDICATOR 7.0 (GLOVE) ×2
GLOVE ECLIPSE 6.5 STRL STRAW (GLOVE) ×3 IMPLANT
GOWN STRL REUS W/TWL LRG LVL3 (GOWN DISPOSABLE) ×6 IMPLANT
KIT ABG SYR 3ML LUER SLIP (SYRINGE) IMPLANT
NDL HYPO 25X1 1.5 SAFETY (NEEDLE) ×1 IMPLANT
NDL HYPO 25X5/8 SAFETYGLIDE (NEEDLE) IMPLANT
NEEDLE HYPO 25X1 1.5 SAFETY (NEEDLE) ×3 IMPLANT
NEEDLE HYPO 25X5/8 SAFETYGLIDE (NEEDLE) IMPLANT
NS IRRIG 1000ML POUR BTL (IV SOLUTION) ×3 IMPLANT
PACK C SECTION WH (CUSTOM PROCEDURE TRAY) ×3 IMPLANT
PAD OB MATERNITY 4.3X12.25 (PERSONAL CARE ITEMS) ×3 IMPLANT
RTRCTR C-SECT PINK 25CM LRG (MISCELLANEOUS) ×2 IMPLANT
STAPLER VISISTAT 35W (STAPLE) ×2 IMPLANT
STRIP CLOSURE SKIN 1/2X4 (GAUZE/BANDAGES/DRESSINGS) IMPLANT
SUT CHROMIC GUT AB #0 18 (SUTURE) ×2 IMPLANT
SUT MNCRL 0 VIOLET CTX 36 (SUTURE) ×3 IMPLANT
SUT MON AB 4-0 PS1 27 (SUTURE) IMPLANT
SUT MONOCRYL 0 CTX 36 (SUTURE) ×6
SUT PLAIN 2 0 (SUTURE) ×3
SUT PLAIN 2 0 XLH (SUTURE) IMPLANT
SUT PLAIN ABS 2-0 CT1 27XMFL (SUTURE) IMPLANT
SUT VIC AB 0 CTB1 36 (SUTURE) ×6 IMPLANT
SUT VIC AB 2-0 CT1 27 (SUTURE) ×3
SUT VIC AB 2-0 CT1 TAPERPNT 27 (SUTURE) ×1 IMPLANT
SUT VIC AB 4-0 PS2 27 (SUTURE) ×2 IMPLANT
SYR CONTROL 10ML LL (SYRINGE) ×3 IMPLANT
TOWEL OR 17X24 6PK STRL BLUE (TOWEL DISPOSABLE) ×3 IMPLANT
TRAY FOLEY CATH 14FR (SET/KITS/TRAYS/PACK) ×2 IMPLANT
WATER STERILE IRR 1000ML POUR (IV SOLUTION) ×3 IMPLANT

## 2014-05-22 NOTE — Plan of Care (Signed)
Problem: Phase I Progression Outcomes Goal: Pain controlled with appropriate interventions Outcome: Completed/Met Date Met:  05/22/14 Goal: Foley catheter patent Outcome: Completed/Met Date Met:  05/22/14 Goal: VS, stable, temp < 100.4 degrees F Outcome: Completed/Met Date Met:  05/22/14

## 2014-05-22 NOTE — Anesthesia Preprocedure Evaluation (Signed)
Anesthesia Evaluation  Patient identified by MRN, date of birth, ID band Patient awake    Reviewed: Allergy & Precautions, H&P , NPO status , Patient's Chart, lab work & pertinent test results  Airway Mallampati: II       Dental   Pulmonary former smoker,  breath sounds clear to auscultation        Cardiovascular Exercise Tolerance: Good Rhythm:regular Rate:Normal     Neuro/Psych    GI/Hepatic hiatal hernia,   Endo/Other    Renal/GU      Musculoskeletal   Abdominal   Peds  Hematology   Anesthesia Other Findings   Reproductive/Obstetrics (+) Pregnancy                             Anesthesia Physical Anesthesia Plan  ASA: II  Anesthesia Plan: Spinal   Post-op Pain Management:    Induction:   Airway Management Planned:   Additional Equipment:   Intra-op Plan:   Post-operative Plan:   Informed Consent: I have reviewed the patients History and Physical, chart, labs and discussed the procedure including the risks, benefits and alternatives for the proposed anesthesia with the patient or authorized representative who has indicated his/her understanding and acceptance.     Plan Discussed with: Anesthesiologist, CRNA and Surgeon  Anesthesia Plan Comments:         Anesthesia Quick Evaluation

## 2014-05-22 NOTE — Transfer of Care (Signed)
Immediate Anesthesia Transfer of Care Note  Patient: Tina [name]  Procedure(s) Performed: Procedure(s): CESAREAN SECTION WITH BILATERAL TUBAL LIGATION (N/A)  Patient Location: PACU  Anesthesia Type:Epidural  Level of Consciousness: awake  Airway & Oxygen Therapy: Patient Spontanous Breathing  Post-op Assessment: Report given to PACU RN  Post vital signs: Reviewed and stable  Complications: No apparent anesthesia complications

## 2014-05-22 NOTE — Anesthesia Postprocedure Evaluation (Signed)
  Anesthesia Post Note  Patient: Tina [name]  Procedure(s) Performed: Procedure(s) (LRB): CESAREAN SECTION WITH BILATERAL TUBAL LIGATION (N/A)  Anesthesia type: Epidural  Patient location: PACU  Post pain: Pain level controlled  Post assessment: Post-op Vital signs reviewed  Last Vitals:  Filed Vitals:   05/22/14 1231  BP: 115/78  Pulse: 103  Temp: 36.9 C  Resp: 16    Post vital signs: Reviewed  Level of consciousness: awake  Complications: No apparent anesthesia complications

## 2014-05-22 NOTE — Brief Op Note (Signed)
05/22/2014  4:02 PM  PATIENT:  Tina [name]  33 y.o. female  PRE-OPERATIVE DIAGNOSIS:  Previous Cesarean Section, Desires Sterilization, term gestation  POST-OPERATIVE DIAGNOSIS:  same  PROCEDURE:  Repeat Cesarean section, Kerr hysterotomy. Modified Pomeroy TL  SURGEON:  Surgeon(s) and Role:    * Ferrell Flam Cathie BeamsA Shakema Surita, MD - Primary  PHYSICIAN ASSISTANT:   ASSISTANTS: Marlinda Mikeanya Bailey, CNM   ANESTHESIA:   epidural and spinal Findings: live female 8lb 13 oz, nl tubes with bilateral paratubal cysts, nl ovaries. Apgar 8/9 EBL:  Total I/O In: 2400 [I.V.:2400] Out: 800 [Urine:100; Blood:700]  BLOOD ADMINISTERED:none  DRAINS: none   LOCAL MEDICATIONS USED:  MARCAINE     SPECIMEN:  Source of Specimen:  portion of right and left tube  DISPOSITION OF SPECIMEN:  PATHOLOGY  COUNTS:  YES  TOURNIQUET:  * No tourniquets in log *  DICTATION: .Other Dictation: Dictation Number [id number]  PLAN OF CARE: Admit to inpatient   PATIENT DISPOSITION:  PACU - hemodynamically stable.   Delay start of Pharmacological VTE agent (>24hrs) due to surgical blood loss or risk of bleeding: no

## 2014-05-22 NOTE — Consult Note (Signed)
Neonatology Note:   Attendance at C-section:    I was asked by Dr. Cherly Hensenousins to attend this repeat C/S at term. The mother is a G4P1A2 O neg, GBS pos with abnormal 1 hour GTT, but normal 3 hour test. ROM at delivery, fluid clear. Infant vigorous with good spontaneous cry and tone. Needed only minimal bulb suctioning. Ap 8/9. Lungs clear to ausc in DR. To CN to care of Pediatrician.   Doretha Souhristie C. Jovanna Hodges, MD

## 2014-05-22 NOTE — Plan of Care (Signed)
Problem: Phase I Progression Outcomes Goal: IS, TCDB as ordered Outcome: Completed/Met Date Met:  05/22/14     

## 2014-05-22 NOTE — Anesthesia Procedure Notes (Addendum)
Spinal Patient location during procedure: OR Start time: 05/22/2014 1:55 PM Staffing Anesthesiologist: Brayton CavesJACKSON, Tanvir Hipple Performed by: anesthesiologist  Preanesthetic Checklist Completed: patient identified, site marked, surgical consent, pre-op evaluation, timeout performed, IV checked, risks and benefits discussed and monitors and equipment checked Spinal Block Patient position: sitting Prep: ChloraPrep Patient monitoring: heart rate, cardiac monitor, continuous pulse ox and blood pressure Approach: midline Location: L3-4 Injection technique: single-shot Needle Needle type: Sprotte  Needle gauge: 24 G Needle length: 9 cm Assessment Sensory level: T4 Additional Notes Patient identified.  Risk benefits discussed including failed block, incomplete pain control, headache, nerve damage, paralysis, blood pressure changes, nausea, vomiting, reactions to medication both toxic or allergic, and postpartum back pain.  Patient expressed understanding and wished to proceed.  All questions were answered.  Sterile technique used throughout procedure.  CSF was clear.  No parasthesia or other complications.  Please see nursing notes for vital signs.   Epidural Patient location during procedure: OB Start time: 05/22/2014 2:08 PM  Staffing Anesthesiologist: Brayton CavesJACKSON, Milo Schreier Performed by: anesthesiologist   Preanesthetic Checklist Completed: patient identified, site marked, surgical consent, pre-op evaluation, timeout performed, IV checked, risks and benefits discussed and monitors and equipment checked  Epidural Patient position: sitting Prep: ChloraPrep and site prepped and draped Patient monitoring: continuous pulse ox and blood pressure Approach: midline Location: L3-L4 Injection technique: LOR air  Needle:  Needle type: Tuohy  Needle gauge: 17 G Needle length: 9 cm and 9 Needle insertion depth: 5 cm cm Catheter type: closed end flexible Catheter size: 19 Gauge Catheter at skin  depth: 10 cm Test dose: negative  Assessment Events: blood not aspirated, injection not painful, no injection resistance, negative IV test and no paresthesia  Additional Notes Patient identified.  Risk benefits discussed including failed block, incomplete pain control, headache, nerve damage, paralysis, blood pressure changes, nausea, vomiting, reactions to medication both toxic or allergic, and postpartum back pain.  Patient expressed understanding and wished to proceed.  All questions were answered.  Sterile technique used throughout procedure and epidural site dressed with sterile barrier dressing. No paresthesia or other complications noted.The patient did not experience any signs of intravascular injection such as tinnitus or metallic taste in mouth nor signs of intrathecal spread such as rapid motor block. Please see nursing notes for vital signs.

## 2014-05-22 NOTE — Plan of Care (Signed)
Problem: Phase I Progression Outcomes Goal: Initial discharge plan identified Outcome: Completed/Met Date Met:  05/22/14

## 2014-05-23 ENCOUNTER — Encounter (HOSPITAL_COMMUNITY): Payer: Self-pay | Admitting: Obstetrics and Gynecology

## 2014-05-23 LAB — CBC
HEMATOCRIT: 26.7 % — AB (ref 36.0–46.0)
Hemoglobin: 9 g/dL — ABNORMAL LOW (ref 12.0–15.0)
MCH: 30.7 pg (ref 26.0–34.0)
MCHC: 33.7 g/dL (ref 30.0–36.0)
MCV: 91.1 fL (ref 78.0–100.0)
Platelets: 146 10*3/uL — ABNORMAL LOW (ref 150–400)
RBC: 2.93 MIL/uL — AB (ref 3.87–5.11)
RDW: 13.5 % (ref 11.5–15.5)
WBC: 11.6 10*3/uL — AB (ref 4.0–10.5)

## 2014-05-23 LAB — BIRTH TISSUE RECOVERY COLLECTION (PLACENTA DONATION)

## 2014-05-23 MED ORDER — RHO D IMMUNE GLOBULIN 1500 UNIT/2ML IJ SOSY
300.0000 ug | PREFILLED_SYRINGE | Freq: Once | INTRAMUSCULAR | Status: AC
  Administered 2014-05-23: 300 ug via INTRAMUSCULAR
  Filled 2014-05-23: qty 2

## 2014-05-23 NOTE — Plan of Care (Signed)
Problem: Discharge Progression Outcomes Goal: Tolerating diet Outcome: Completed/Met Date Met:  05/23/14

## 2014-05-23 NOTE — Plan of Care (Signed)
Problem: Discharge Progression Outcomes Goal: Activity appropriate for discharge plan Outcome: Completed/Met Date Met:  05/23/14

## 2014-05-23 NOTE — Plan of Care (Signed)
Problem: Phase I Progression Outcomes Goal: Voiding adequately Outcome: Completed/Met Date Met:  05/23/14 Goal: OOB as tolerated unless otherwise ordered Outcome: Completed/Met Date Met:  05/23/14

## 2014-05-23 NOTE — Plan of Care (Signed)
Problem: Phase I Progression Outcomes Goal: Other Phase I Outcomes/Goals Outcome: Not Applicable Date Met:  [birth year]  Problem: Phase II Progression Outcomes Goal: Pain controlled on oral analgesia Outcome: Completed/Met Date Met:  05/23/14 Goal: Afebrile, VS remain stable Outcome: Completed/Met Date Met:  05/23/14 Goal: Incision intact & without signs/symptoms of infection Outcome: Completed/Met Date Met:  05/23/14 Goal: Rh isoimmunization per orders Outcome: Completed/Met Date Met:  05/23/14 Goal: Tolerating diet Outcome: Completed/Met Date Met:  05/23/14

## 2014-05-23 NOTE — Plan of Care (Signed)
Problem: Phase II Progression Outcomes Goal: Progress activity as tolerated unless otherwise ordered Outcome: Completed/Met Date Met:  05/23/14     

## 2014-05-23 NOTE — Op Note (Signed)
NAMBerna Bue:  [name], Tina              ACCOUNT NO.:  [id number]  MEDICAL RECORD NO.:  [id number]  LOCATION:  9145                          FACILITY:  [facility]  PHYSICIAN:  Maxie BetterSheronette Levonne Carreras, M.D.DATE OF BIRTH:  [birth year]  DATE OF PROCEDURE:  05/22/2014 DATE OF DISCHARGE:                              OPERATIVE REPORT   PREOPERATIVE DIAGNOSIS:  Previous cesarean section, term gestation, desires sterilization.  POSTOPERATIVE DIAGNOSIS:  Previous cesarean section, term gestation, desires sterilization.  PROCEDURE:  Repeat cesarean section, [name] hysterotomy  Modified Pomeroy tubal ligation.  ANESTHESIA:  Spinal/epidural.  SURGEON:  Maxie BetterSheronette Antonieta Slaven, MD  ASSISTANT:  Marlinda Mikeanya Bailey, CNM.  DESCRIPTION OF PROCEDURE:  The patient initially had a placement of spinal anesthesia, but due to suboptimal level, she had an epidural subsequently placed.  The patient was then placed in the supine position with a left lateral tilt.  An indwelling Foley catheter was sterilely placed.  The patient was then sterilely prepped and draped in usual fashion.  A 0.25% Marcaine was injected along the previous Pfannenstiel skin incision.  Pfannenstiel skin incision was then made, carried down to the rectus fascia.  The rectus fascia was opened transversely with cautery.  The rectus fascia was then bluntly and sharply dissected off the rectus muscle in superior and inferior fashion.  The rectus muscle was split in the midline.  The parietal peritoneum was entered bluntly and extended.  An Alexis retractor was then placed.  The bladder had some adhesions and was high in the lower uterine segment with a thin lower uterine segment noted. Vesicouterine peritoneum was opened transversely.  The bladder was then gently dissected off the lower uterine segment and displaced inferiorly.  A curvilinear low transverse uterine incision was then made and extended with bandage scissors. Artificial rupture of membranes was  done.  Clear and copious amniotic fluid was noted.  Subsequent delivery of a live female was accomplished from occiput anterior position.  The baby was bulb suctioned in the abdomen.  The cord was clamped, cut.  The baby was transferred to the awaiting pediatricians who assigned Apgars of 8 and 9 at 1 and 5 minute respectively.  The placenta was anterior and was manually removed. Uterine cavity was cleaned of debris.  Uterine incision had no extension, was closed in 1 layer with good hemostasis noted.  A 0 Monocryl was used with a running lock stitch.  Attention was then turned to the fallopian tubes and ovaries both of which were normal.  There were bilateral paratubal cysts that remained.  The midportion of both fallopian tubes was grasped with a Babcock.  The underlying mesosalpinx was opened.  The intervening segment of tube was then tied with 0 chromic suture proximally and distally x2 and the intervening segment of both fallopian tubes were then removed.  Attention was then turned back to the uterine incision which still remained with good hemostasis.  Small bleeders along the peritoneal edges was cauterized. The abdomen was then irrigated and suctioned of debris.  The Alexis retractor was then removed.  Interceed was placed in the lower uterine segment.  The parietal peritoneum was then closed with 2-0 Vicryl.  The rectus fascia was closed with 0 Vicryl  x2.  The subcutaneous area was irrigated, small bleeders cauterized.  Interrupted 2-0 plain sutures placed and the skin approximated with Ethicon staples.  SPECIMENS:  Portion of the right and left fallopian tubes sent to Pathology.  Placenta was not sent.  ESTIMATED BLOOD LOSS:  700 mL.  INTRAOPERATIVE FLUID:  2400 mL.  URINE OUTPUT:  100 mL.  COUNTS:  Sponge and instrument counts x2 was correct.  COMPLICATION:  None.  The patient tolerated the procedure well and was transferred to recovery room in stable condition.  The  baby was placed on skin to skin during the surgery.  Weight of the baby was 8 pounds 13 ounces.     Maxie BetterSheronette Davieon Stockham, M.D.     [place]/MEDQ  D:  05/23/2014  T:  05/23/2014  Job:  [id number]

## 2014-05-23 NOTE — Progress Notes (Addendum)
Patient ID: Tina [name], female   DOB: 06/25/1981, 33 y.o.   MRN: [id number] Subjective: POD# 1 Information for the patient's newborn:  Tina [name], Girl Tina [name] [[id number]][[id number]]  female   Reports feeling well Feeding: breast Patient reports tolerating PO.  Breast symptoms: none Pain controlled with ibuprofen (OTC) and narcotic analgesics including Percocet Denies HA/SOB/C/P/N/V/dizziness. Flatus absent. She reports vaginal bleeding as normal, without clots.  She is ambulating, urinating without difficult.     Objective:   VS:  Filed Vitals:   05/22/14 2030 05/22/14 2230 05/23/14 0030 05/23/14 0230  BP: 101/59 102/50 97/57 104/57  Pulse: 72 75 73 70  Temp: 98.6 F (37 C) 98.9 F (37.2 C) 98.1 F (36.7 C) 98.7 F (37.1 C)  TempSrc: Oral Oral Oral Oral  Resp: 18 18 18 16   Height:      Weight:      SpO2: 95% 95% 100% 95%     Intake/Output Summary (Last 24 hours) at 05/23/14 [id number] Last data filed at 05/23/14 0515  Gross per 24 hour  Intake   3100 ml  Output   2525 ml  Net    575 ml        Recent Labs  05/23/14 0635  WBC 11.6*  HGB 9.0*  HCT 26.7*  PLT 146*     Blood type: O NEG (11/13 0855)   Rubella: Immune (04/22 0000)     Physical Exam:  General: alert, cooperative and no distress CV: Regular rate and rhythm, S1S2 present or without murmur or extra heart sounds Resp: clear Abdomen: soft, nontender, normal bowel sounds Incision: Tegaderm adn Honeycomb dressing D/I - small amount of serosanguineous drainage on RT side of incision Uterine Fundus: firm, U even, nontender Lochia: minimal Ext: extremities normal, atraumatic, no cyanosis or edema and Homans sign is negative, no sign of DVT   Assessment/Plan: 33 y.o.   POD# 1.  s/p Cesarean Delivery.  Indications: Repeat with BTL                Principal Problem:   Postpartum care following cesarean delivery (11/16) Rh Negative   Doing well, stable.               Regular diet as tolerated D/C IV after  successfully voiding Rhophylac 300 mcg IM prior to D/C Ambulate Routine post-op care  Tina GowerAWSON, Tina [name], M, MSN, CNM 05/23/2014, 9:22 AM

## 2014-05-24 LAB — RH IG WORKUP (INCLUDES ABO/RH)
ABO/RH(D): O NEG
Antibody Screen: NEGATIVE
Fetal Screen: NEGATIVE
GESTATIONAL AGE(WKS): 39
Unit division: 0

## 2014-05-24 MED ORDER — MAGNESIUM OXIDE 400 (241.3 MG) MG PO TABS
200.0000 mg | ORAL_TABLET | Freq: Every day | ORAL | Status: DC
  Administered 2014-05-24 – 2014-05-25 (×2): 200 mg via ORAL
  Filled 2014-05-24 (×2): qty 0.5

## 2014-05-24 NOTE — Progress Notes (Signed)
POSTOPERATIVE DAY # 2 S/P Repeat C/S with BTL   S:         Reports feeling okay, but feels like abdomen is very tight and distended and increased pain             Tolerating po intake / no nausea / no vomiting / some flatus / no BM             Bleeding is light             Pain controlled withprescription NSAID's including Motrin and Percocet             Up ad lib / ambulatory/ voiding QS  Newborn bottle feeding - Newborn is frequently fussy and having more reflux with large meconium stools; can hear gas in belly              Occasional leg cramps bilaterally   O:  VS: BP 116/66 mmHg  Pulse 97  Temp(Src) 97.7 F (36.5 C) (Oral)  Resp 18  Ht 5\' 1"  (1.549 m)  Wt 64.411 kg (142 lb)  BMI 26.84 kg/m2  SpO2 99%  Breastfeeding? Unknown   LABS:               Recent Labs  05/23/14 0635  WBC 11.6*  HGB 9.0*  PLT 146*               Bloodtype: --/--/O NEG (11/17 16100635) - Rhogam 11/17  Rubella: Immune (04/22 0000)                                             I&O: Intake/Output      11/17 0701 - 11/18 0700 11/18 0701 - 11/19 0700   I.V. (mL/kg)     Total Intake(mL/kg)     Urine (mL/kg/hr) 600 (0.4)    Blood     Total Output 600     Net -600                       Physical Exam:             Alert and Oriented X3  Lungs: Clear and unlabored  Heart: regular rate and rhythm / no mumurs  Abdomen: soft, non-tender, slightly distended             Fundus: firm, non-tender, U-1             Dressing: honeycomb dressing intact, small brown drainage noted on right side              Incision:  approximated with staples / no erythema / no ecchymosis / brown drainage noted on right side  Perineum: intact  Lochia: scant, bright red bleeding, no clots noted  Extremities: non-pitting dependent edema, no calf pain or tenderness, no Homans bilaterally  A:        POD # 2 S/P repeat C/S with BTL            Rh-Negative - Rhogam given 11/17            ABL anemia - on Ferrous sulfate  Doing well - stable  P:        Routine postoperative care              Instructed to ambulate in halls today  Instructed warm fluids, soups, and warm shower to help with gas pain and distended             Discussed breast care for bottle feeding mothers - supportive bra, ice packs, cabbage leaves             Instructed to discuss baby's reflux and stools with Peds             Continue iron supplement and begin Magnesium Oxide 200mg  daily with Iron  Milinda CaveMeredith Jacklyne Baik, SNM

## 2014-05-24 NOTE — Plan of Care (Signed)
Problem: Phase II Progression Outcomes Goal: Other Phase II Outcomes/Goals Outcome: Not Applicable Date Met:  09/73/53  Problem: Discharge Progression Outcomes Goal: Barriers To Progression Addressed/Resolved Outcome: Completed/Met Date Met:  29/92/42 Goal: Complications resolved/controlled Outcome: Completed/Met Date Met:  05/24/14 Goal: Pain controlled with appropriate interventions Outcome: Completed/Met Date Met:  05/24/14

## 2014-05-24 NOTE — Plan of Care (Signed)
Problem: Discharge Progression Outcomes Goal: MMR given as ordered Outcome: Not Applicable Date Met:  87/57/97

## 2014-05-25 MED ORDER — FERROUS SULFATE 325 (65 FE) MG PO TABS: 325.0000 mg | ORAL_TABLET | Freq: Two times a day (BID) | ORAL | Status: DC

## 2014-05-25 MED ORDER — MAGNESIUM OXIDE 400 (241.3 MG) MG PO TABS: 200.0000 mg | ORAL_TABLET | Freq: Every day | ORAL | Status: DC

## 2014-05-25 MED ORDER — OXYCODONE-ACETAMINOPHEN 5-325 MG PO TABS: 1.0000 | ORAL_TABLET | ORAL | Status: DC | PRN

## 2014-05-25 MED ORDER — IBUPROFEN 600 MG PO TABS: 600.0000 mg | ORAL_TABLET | Freq: Four times a day (QID) | ORAL | Status: DC

## 2014-05-25 NOTE — Discharge Summary (Signed)
POSTOPERATIVE DISCHARGE SUMMARY:  Patient ID: Tina GlatterBrittany D Lewis MRN: [id number] DOB/AGE: 33/11/1980 33 y.o.  Admit date: 05/22/2014 Admission Diagnoses: 39.1 wks Repeat Cesarean Delivery / BTL   Discharge date:  05/25/2014 Discharge Diagnoses: S/P C/S due to Repeat on 05/22/2014        Prenatal history: [id number]   EDC : 05/28/2014, Ultrasound [facility] since 9.[redacted] wks gestation. Primary provider : Dr. Cherly Hensenousins Prenatal course complicated by Previous Cesarean Delivery  Prenatal Labs: ABO, Rh: O NEG (11/17 0635) / Rhophylac given 05/23/2014 Antibody: NEG (11/17 0635) Rubella: Immune (04/22 0000)   RPR: NON REAC (11/13 0855)  HBsAg: Negative (04/22 0000)  HIV: Non-reactive (04/22 0000)  GTT : Abnormal 1 hr GTT / Normal 3 hr GTT GBS:  Positive   Medical / Surgical History :  Past medical history:  Past Medical History  Diagnosis Date  . Termination of pregnancy (fetus) 1996, 1999    x 2 CONFIDENTIAL  . History of hiatal hernia   . Postpartum care following cesarean delivery (11/16) 05/22/2014    Past surgical history:  Past Surgical History  Procedure Laterality Date  . Cesarean section  09/07/2010    x 1  . Dilation and curettage of uterus    . Wisdom tooth extraction    . Cesarean section with bilateral tubal ligation N/A 05/22/2014    Procedure: CESAREAN SECTION WITH BILATERAL TUBAL LIGATION;  Surgeon: Serita KyleSheronette A Cousins, MD;  Location: [facility];  Service: Obstetrics;  Laterality: N/A;     Allergies: Penicillins; Shellfish-derived products; and Latex    Physical Exam:   VSS: Blood pressure 107/67, pulse 70, temperature 97.8 F (36.6 C), temperature source Oral, resp. rate 18, height 5\' 1"  (1.549 m), weight 64.411 kg (142 lb), SpO2 99 %, Not currently breastfeeding.  LABS:  Recent Labs  05/23/14 0635  WBC 11.6*  HGB 9.0*  PLT 146*    Newborn Data Live born female  Birth Weight: 8 lb 13.3 oz (4006  g) APGAR: 8, 9  See operative report for further details  Home with mother.  Discharge Instructions:  Wound Care: keep clean and dry / return to [facility] on Monday 05/29/2014 for staple removal Postpartum Instructions: [facility] discharge booklet - instructions reviewed Medications:    Medication List    TAKE these medications        famotidine 20 MG tablet  Commonly known as:  PEPCID  Take 20 mg by mouth 2 (two) times daily.     ferrous sulfate 325 (65 FE) MG tablet  Take 1 tablet (325 mg total) by mouth 2 (two) times daily with a meal.     FLINSTONES GUMMIES OMEGA-3 DHA PO  Take 2 tablets by mouth daily.     ibuprofen 600 MG tablet  Commonly known as:  ADVIL,MOTRIN  Take 1 tablet (600 mg total) by mouth every 6 (six) hours.     magnesium oxide 400 (241.3 MG) MG tablet  Commonly known as:  MAG-OX  Take 0.5 tablets (200 mg total) by mouth daily.     oxyCODONE-acetaminophen 5-325 MG per tablet  Commonly known as:  PERCOCET/ROXICET  Take 1 tablet by mouth every 4 (four) hours as needed (for pain scale less than 7).     PROCTOSOL HC 2.5 % rectal cream  Generic drug:  hydrocortisone  Place 1 application rectally 2 (two) times daily as needed for hemorrhoids.           Follow-up Information  Follow up with COUSINS,SHERONETTE A, MD. Schedule an appointment as soon as possible for a visit in 1 week.   Specialty:  Obstetrics and Gynecology   Why:  For staple removal with one of the office nurses   Contact information:   [address] [place] [address] [phone]       Follow up with COUSINS,SHERONETTE A, MD. Schedule an appointment as soon as possible for a visit in 6 weeks.   Specialty:  Obstetrics and Gynecology   Why:  postpartum visit   Contact information:   [address] [place] [address] [phone]         Signed: Kenard GowerAWSON, Brittni Hult, M, MSN, CNM 05/25/2014, 9:46 AM

## 2014-05-25 NOTE — Progress Notes (Addendum)
POSTOPERATIVE DAY # 3 S/P Repeat C/S with BTL   S:         Reports feeling okay, but feels like abdomen is very tight and distended and increased pain             Tolerating po intake / no nausea / no vomiting / some flatus / no BM             Bleeding is light             Pain controlled withprescription NSAID's including Motrin and Percocet             Up ad lib / ambulatory/ voiding QS  Newborn bottle feeding              Occasional leg cramps bilaterally   O:  VS: BP 107/67 mmHg  Pulse 70  Temp(Src) 97.8 F (36.6 C) (Oral)  Resp 18  Ht 5\' 1"  (1.549 m)  Wt  64.411 kg (142 lb)  BMI 26.84 kg/m2  SpO2 99%  Bottlefeeding   LABS:                Recent Labs  05/23/14 0635  WBC 11.6*  HGB 9.0*  PLT 146*               Bloodtype: --/--/O NEG (11/17 [id number]) - Rhogam 11/17  Rubella: Immune (04/22 0000)                                             I&O: Intake/Output      11/18 0701 - 11/19 0700 11/19 0701 - 11/20 0700   Urine (mL/kg/hr)     Total Output       Net            Urine Occurrence 1 x                 Physical Exam:             Alert and Oriented X3  Lungs: Clear and unlabored  Heart: regular rate and rhythm / no mumurs  Abdomen: soft, non-tender, slightly distended             Fundus: firm, non-tender, U-2             Dressing: honeycomb dressing intact, small brown drainage noted on right side              Incision:  approximated with staples / no erythema / no ecchymosis / brown drainage noted on right side  Perineum: intact  Lochia: scant, bright red bleeding, no clots noted  Extremities: non-pitting dependent edema, no calf pain or tenderness, no Homans bilaterally  A:        POD # 3 S/P repeat C/S with BTL            Rh-Negative - Rhogam given 11/17            ABL anemia - on Ferrous sulfate            Doing well - stable  P:         Routine postoperative care              Instructed warm fluids, soups, and warm shower to help with gas pain and  distended             Continue  iron supplement and begin Magnesium Oxide 200mg  daily with Iron    D/C home today  Tina GowerDAWSON, Chakia Counts, M MSN, CNM 05/25/2014, 9:26 AM

## 2014-05-25 NOTE — Discharge Instructions (Signed)
Nutrition for the New Mother  A new mother needs good health and nutrition so she can have energy to take care of a new baby. Whether a mother breastfeeds or formula feeds the baby, it is important to have a well-balanced diet. Foods from all the food groups should be chosen to meet the new mother's energy needs and to give her the nutrients needed for repair and healing.  A HEALTHY EATING PLAN The My Pyramid plan for Moms outlines what you should eat to help you and your baby stay healthy. The energy and amount of food you need depends on whether or not you are breastfeeding. If you are breastfeeding you will need more nutrients. If you choose not to breastfeed, your nutrition goal should be to return to a healthy weight. Limiting calories may be needed if you are not breastfeeding.  HOME CARE INSTRUCTIONS   For a personal plan based on your unique needs, see your Registered Dietitian or visit [email or web address].mypyramid.gov.  Eat a variety of foods. The plan below will help guide you. The following chart has a suggested daily meal plan from the My [employer]ram[employer] for Moms.  Eat a variety of fruits and vegetables.  Eat more dark green and orange vegetables and cooked dried beans.  Make half your grains whole grains. Choose whole instead of refined grains.  Choose low-fat or lean meats and poultry.  Choose low-fat or fat-free dairy products like milk, cheese, or yogurt. Fruits  Breastfeeding: 2 cups  Non-Breastfeeding: 2 cups  What Counts as a serving?  1 cup of fruit or juice.   cup dried fruit. Vegetables  Breastfeeding: 3 cups  Non-Breastfeeding: 2  cups  What Counts as a serving?  1 cup raw or cooked vegetables.  Juice or 2 cups raw leafy vegetables. Grains  Breastfeeding: 8 oz  Non-Breastfeeding: 6 oz  What Counts as a serving?  1 slice bread.  1 oz ready-to-eat cereal.   cup cooked pasta, rice, or cereal. Meat and Beans  Breastfeeding: 6  oz  Non-Breastfeeding: 5   oz  What Counts as a serving?  1 oz lean meat, poultry, or fish   cup cooked dry beans   oz nuts or 1 egg  1 tbs peanut butter Milk  Breastfeeding: 3 cups  Non-Breastfeeding: 3 cups  What Counts as a serving?  1 cup milk.  8 oz yogurt.  1  oz cheese.  2 oz processed cheese. TIPS FOR THE BREASTFEEDING MOM  Rapid weight loss is not suggested when you are breastfeeding. By simply breastfeeding, you will be able to lose the weight gained during your pregnancy. Your caregiver can keep track of your weight and tell you if your weight loss is appropriate.  Be sure to drink fluids. You may notice that you are thirstier than usual. A suggestion is to drink a glass of water or other beverage whenever you breastfeed.  Avoid alcohol as it can be passed into your breast milk.  Limit caffeine drinks to no more than 2 to 3 cups per day.  You may need to keep taking your prenatal vitamin while you are breastfeeding. Talk with your caregiver about taking a vitamin or supplement. RETURING TO A HEALTHY WEIGHT  The My Pyramid Plan for Moms will help you return to a healthy weight. It will also provide the nutrients you need.  You may need to limit "empty" calories. These include:  High fat foods like fried foods, fatty meats, fast food, butter, and mayonnaise.  High  sugar foods like sodas, jelly, candy, and sweets. °· Be physically active. Include 30 minutes of exercise or more each day. Choose an activity you like such as walking, swimming, biking, or aerobics. Check with your caregiver before you start to exercise. °Document Released: 09/30/2007 Document Revised: 09/15/2011 Document Reviewed: 09/30/2007 °ExitCare® Patient Information ©2015 [employer]. This information is not intended to replace advice given to you by your health care provider. Make sure you discuss any questions you have with your health care provider. °Postpartum Depression and Baby Blues °The postpartum period  begins right after the birth of a baby. During this time, there is often a great amount of joy and excitement. It is also a time of many changes in the life of the parents. Regardless of how many times a mother gives birth, each child brings new challenges and dynamics to the family. It is not unusual to have feelings of excitement along with confusing shifts in moods, emotions, and thoughts. All mothers are at risk of developing postpartum depression or the "baby blues." These mood changes can occur right after giving birth, or they may occur many months after giving birth. The baby blues or postpartum depression can be mild or severe. Additionally, postpartum depression can go away rather quickly, or it can be a long-term condition.  °CAUSES °Raised hormone levels and the rapid drop in those levels are thought to be a main cause of postpartum depression and the baby blues. A number of hormones change during and after pregnancy. Estrogen and progesterone usually decrease right after the delivery of your baby. The levels of thyroid hormone and various cortisol steroids also rapidly drop. Other factors that play a role in these mood changes include major life events and genetics.  °RISK FACTORS °If you have any of the following risks for the baby blues or postpartum depression, know what symptoms to watch out for during the postpartum period. Risk factors that may increase the likelihood of getting the baby blues or postpartum depression include: °· Having a personal or family history of depression.   °· Having depression while being pregnant.   °· Having premenstrual mood issues or mood issues related to oral contraceptives. °· Having a lot of life stress.   °· Having marital conflict.   °· Lacking a social support network.   °· Having a baby with special needs.   °· Having health problems, such as diabetes.   °SIGNS AND SYMPTOMS °Symptoms of baby blues include: °· Brief changes in mood, such as going from extreme  happiness to sadness. °· Decreased concentration.   °· Difficulty sleeping.   °· Crying spells, tearfulness.   °· Irritability.   °· Anxiety.   °Symptoms of postpartum depression typically begin within the first month after giving birth. These symptoms include: °· Difficulty sleeping or excessive sleepiness.   °· Marked weight loss.   °· Agitation.   °· Feelings of worthlessness.   °· Lack of interest in activity or food.   °Postpartum psychosis is a very serious condition and can be dangerous. Fortunately, it is rare. Displaying any of the following symptoms is cause for immediate medical attention. Symptoms of postpartum psychosis include:  °· Hallucinations and delusions.   °· Bizarre or disorganized behavior.   °· Confusion or disorientation.   °DIAGNOSIS  °A diagnosis is made by an evaluation of your symptoms. There are no medical or lab tests that lead to a diagnosis, but there are various questionnaires that a health care provider may use to identify those with the baby blues, postpartum depression, or psychosis. Often, a screening tool called the Edinburgh Postnatal Depression   Scale is used to diagnose depression in the postpartum period.  °TREATMENT °The baby blues usually goes away on its own in 1-2 weeks. Social support is often all that is needed. You will be encouraged to get adequate sleep and rest. Occasionally, you may be given medicines to help you sleep.  °Postpartum depression requires treatment because it can last several months or longer if it is not treated. Treatment may include individual or group therapy, medicine, or both to address any social, physiological, and psychological factors that may play a role in the depression. Regular exercise, a healthy diet, rest, and social support may also be strongly recommended.  °Postpartum psychosis is more serious and needs treatment right away. Hospitalization is often needed. °HOME CARE INSTRUCTIONS °· Get as much rest as you can. Nap when the baby  sleeps.   °· Exercise regularly. Some women find yoga and walking to be beneficial.   °· Eat a balanced and nourishing diet.   °· Do little things that you enjoy. Have a cup of tea, take a bubble bath, read your favorite magazine, or listen to your favorite music. °· Avoid alcohol.   °· Ask for help with household chores, cooking, grocery shopping, or running errands as needed. Do not try to do everything.   °· Talk to people close to you about how you are feeling. Get support from your partner, family members, friends, or other new moms. °· Try to stay positive in how you think. Think about the things you are grateful for.   °· Do not spend a lot of time alone.   °· Only take over-the-counter or prescription medicine as directed by your health care provider. °· Keep all your postpartum appointments.   °· Let your health care provider know if you have any concerns.   °SEEK MEDICAL CARE IF: °You are having a reaction to or problems with your medicine. °SEEK IMMEDIATE MEDICAL CARE IF: °· You have suicidal feelings.   °· You think you may harm the baby or someone else. °MAKE SURE YOU: °· Understand these instructions. °· Will watch your condition. °· Will get help right away if you are not doing well or get worse. °Document Released: 03/27/2004 Document Revised: 06/28/2013 Document Reviewed: 04/04/2013 °ExitCare® Patient Information ©2015 [employer]. This information is not intended to replace advice given to you by your health care provider. Make sure you discuss any questions you have with your health care provider. °Breast Pumping Tips °If you are breastfeeding, there may be times when you cannot feed your baby directly. Returning to work or going on a trip are common examples. Pumping allows you to store breast milk and feed it to your baby later.  °You may not get much milk when you first start to pump. Your breasts should start to make more after a few days. If you pump at the times you usually feed your baby,  you may be able to keep making enough milk to feed your baby without also using formula. The more often you pump, the more milk you will produce.  °WHEN SHOULD I PUMP?  °· You can begin to pump soon after delivery. However, some experts recommend waiting about 4 weeks before giving your infant a bottle to make sure breastfeeding is going well.  °· If you plan to return to work, begin pumping a few weeks before. This will help you develop techniques that work best for you. It also lets you build up a supply of breast milk.   °· When you are with your infant, feed on demand and   pump after each feeding.   When you are away from your infant for several hours, pump for about 15 minutes every 2-3 hours. Pump both breasts at the same time if you can.   If your infant has a formula feeding, make sure to pump around the same time.   If you drink any alcohol, wait 2 hours before pumping.  HOW DO I PREPARE TO PUMP? Your let-down reflexis the natural reaction to stimulation that makes your breast milk flow. It is easier to stimulate this reflex when you are relaxed. Find relaxation techniques that work for you. If you have difficulty with your let-down reflex, try these methods:   Smell one of your infant's blankets or an item of clothing.   Look at a picture or video of your infant.   Sit in a quiet, private space.   Massage the breast you plan to pump.   Place soothing warmth on the breast.   Play relaxing music.  WHAT ARE SOME GENERAL BREAST PUMPING TIPS?  Wash your hands before you pump. You do not need to wash your nipples or breasts.  There are three ways to pump.  You can use your hand to massage and compress your breast.  You can use a handheld manual pump.  You can use an electric pump.   Make sure the suction cup (flange) on the breast pump is the right size. Place the flange directly over the nipple. If it is the wrong size or placed the wrong way, it may be painful and  cause nipple damage.   If pumping is uncomfortable, apply a small amount of purified or modified lanolin to your nipple and areola.  If you are using an electric pump, adjust the speed and suction power to be more comfortable.  If pumping is painful or if you are not getting very much milk, you may need a different type of pump. A lactation consultant can help you determine what type of pump to use.   Keep a full water bottle near you at all times. Drinking lots of fluid helps you make more milk.  You can store your milk to use later. Pumped breast milk can be stored in a sealable, sterile container or plastic bag. Label all stored breast milk with the date you pumped it.  Milk can stay out at room temperature for up to 8 hours.  You can store your milk in the refrigerator for up to 8 days.  You can store your milk in the freezer for 3 months. Thaw frozen milk using warm water. Do not put it in the microwave.  Do not smoke. Smoking can lower your milk supply and harm your infant. If you need help quitting, ask your health care provider to recommend a program.  WHEN SHOULD I CALL MY HEALTH CARE PROVIDER OR A LACTATION CONSULTANT?  You are having trouble pumping.  You are concerned that you are not making enough milk.  You have nipple pain, soreness, or redness.  You want to use birth control. Birth control pills may lower your milk supply. Talk to your health care provider about your options. Document Released: 12/11/2009 Document Revised: 06/28/2013 Document Reviewed: 04/15/2013 [facility] Patient Information 2015 [place], [place]. This information is not intended to replace advice given to you by your health care provider. Make sure you discuss any questions you have with your health care provider. Breastfeeding and Mastitis Mastitis is inflammation of the breast tissue. It can occur in women who are  breastfeeding. This can make breastfeeding painful. Mastitis will sometimes go away  on its own. Your health care provider will help determine if treatment is needed. °CAUSES °Mastitis is often associated with a blocked milk (lactiferous) duct. This can happen when too much milk builds up in the breast. Causes of excess milk in the breast can include: °· Poor latch-on. If your baby is not latched onto the breast properly, she or he may not empty your breast completely while breastfeeding. °· Allowing too much time to pass between feedings. °· Wearing a bra or other clothing that is too tight. This puts extra pressure on the lactiferous ducts so milk does not flow through them as it should. °Mastitis can also be caused by a bacterial infection. Bacteria may enter the breast tissue through cuts or openings in the skin. In women who are breastfeeding, this may occur because of cracked or irritated skin. Cracks in the skin are often caused when your baby does not latch on properly to the breast. °SIGNS AND SYMPTOMS °· Swelling, redness, tenderness, and pain in an area of the breast. °· Swelling of the glands under the arm on the same side. °· Fever may or may not accompany mastitis. °If an infection is allowed to progress, a collection of pus (abscess) may develop. °DIAGNOSIS  °Your health care provider can usually diagnose mastitis based on your symptoms and a physical exam. Tests may be done to help confirm the diagnosis. These may include: °· Removal of pus from the breast by applying pressure to the area. This pus can be examined in the lab to determine which bacteria are present. If an abscess has developed, the fluid in the abscess can be removed with a needle. This can also be used to confirm the diagnosis and determine the bacteria present. In most cases, pus will not be present. °· Blood tests to determine if your body is fighting a bacterial infection. °· Mammogram or ultrasound tests to rule out other problems or diseases. °TREATMENT  °Mastitis that occurs with breastfeeding will sometimes go  away on its own. Your health care provider may choose to wait 24 hours after first seeing you to decide whether a prescription medicine is needed. If your symptoms are worse after 24 hours, your health care provider will likely prescribe an antibiotic medicine to treat the mastitis. He or she will determine which bacteria are most likely causing the infection and will then select an appropriate antibiotic medicine. This is sometimes changed based on the results of tests performed to identify the bacteria, or if there is no response to the antibiotic medicine selected. Antibiotic medicines are usually given by mouth. You may also be given medicine for pain. °HOME CARE INSTRUCTIONS °· Only take over-the-counter or prescription medicines for pain, fever, or discomfort as directed by your health care provider. °· If your health care provider prescribed an antibiotic medicine, take the medicine as directed. Make sure you finish it even if you start to feel better. °· Do not wear a tight or underwire bra. Wear a soft, supportive bra. °· Increase your fluid intake, especially if you have a fever. °· Continue to empty the breast. Your health care provider can tell you whether this milk is safe for your infant or needs to be thrown out. You may be told to stop nursing until your health care provider thinks it is safe for your baby. Use a breast pump if you are advised to stop nursing. °· Keep your nipples clean   and dry. °· Empty the first breast completely before going to the other breast. If your baby is not emptying your breasts completely for some reason, use a breast pump to empty your breasts. °· If you go back to work, pump your breasts while at work to stay in time with your nursing schedule. °· Avoid allowing your breasts to become overly filled with milk (engorged). °SEEK MEDICAL CARE IF: °· You have pus-like discharge from the breast. °· Your symptoms do not improve with the treatment prescribed by your health care  provider within 2 days. °SEEK IMMEDIATE MEDICAL CARE IF: °· Your pain and swelling are getting worse. °· You have pain that is not controlled with medicine. °· You have a red line extending from the breast toward your armpit. °· You have a fever or persistent symptoms for more than 2-3 days. °· You have a fever and your symptoms suddenly get worse. °MAKE SURE YOU:  °· Understand these instructions. °· Will watch your condition. °· Will get help right away if you are not doing well or get worse. °Document Released: 10/18/2004 Document Revised: 06/28/2013 Document Reviewed: 01/27/2013 °[employer]® Patient Information ©2015 [employer]. This information is not intended to replace advice given to you by your health care provider. Make sure you discuss any questions you have with your health care provider. °Breastfeeding °Deciding to breastfeed is one of the best choices you can make for you and your baby. A change in hormones during pregnancy causes your breast tissue to grow and increases the number and size of your milk ducts. These hormones also allow proteins, sugars, and fats from your blood supply to make breast milk in your milk-producing glands. Hormones prevent breast milk from being released before your baby is born as well as prompt milk flow after birth. Once breastfeeding has begun, thoughts of your baby, as well as his or her sucking or crying, can stimulate the release of milk from your milk-producing glands.  °BENEFITS OF BREASTFEEDING °For Your Baby °· Your first milk (colostrum) helps your baby's digestive system function better.   °· There are antibodies in your milk that help your baby fight off infections.   °· Your baby has a lower incidence of asthma, allergies, and sudden infant death syndrome.   °· The nutrients in breast milk are better for your baby than infant formulas and are designed uniquely for your baby's needs.   °· Breast milk improves your baby's brain development.   °· Your baby is  less likely to develop other conditions, such as childhood obesity, asthma, or type 2 diabetes mellitus.   °For You  °· Breastfeeding helps to create a very special bond between you and your baby.   °· Breastfeeding is convenient. Breast milk is always available at the correct temperature and costs nothing.   °· Breastfeeding helps to burn calories and helps you lose the weight gained during pregnancy.   °· Breastfeeding makes your uterus contract to its prepregnancy size faster and slows bleeding (lochia) after you give birth.   °· Breastfeeding helps to lower your risk of developing type 2 diabetes mellitus, osteoporosis, and breast or ovarian cancer later in life. °SIGNS THAT YOUR BABY IS HUNGRY °Early Signs of Hunger  °· Increased alertness or activity. °· Stretching. °· Movement of the head from side to side. °· Movement of the head and opening of the mouth when the corner of the mouth or cheek is stroked (rooting). °· Increased sucking sounds, smacking lips, cooing, sighing, or squeaking. °· Hand-to-mouth movements. °· Increased sucking of fingers   or hands. Late Signs of Hunger  Fussing.  Intermittent crying. Extreme Signs of Hunger Signs of extreme hunger will require calming and consoling before your baby will be able to breastfeed successfully. Do not wait for the following signs of extreme hunger to occur before you initiate breastfeeding:   Restlessness.  A loud, strong cry.   Screaming. BREASTFEEDING BASICS Breastfeeding Initiation  Find a comfortable place to sit or lie down, with your neck and back well supported.  Place a pillow or rolled up blanket under your baby to bring him or her to the level of your breast (if you are seated). Nursing pillows are specially designed to help support your arms and your baby while you breastfeed.  Make sure that your baby's abdomen is facing your abdomen.   Gently massage your breast. With your fingertips, massage from your chest wall toward  your nipple in a circular motion. This encourages milk flow. You may need to continue this action during the feeding if your milk flows slowly.  Support your breast with 4 fingers underneath and your thumb above your nipple. Make sure your fingers are well away from your nipple and your baby's mouth.   Stroke your baby's lips gently with your finger or nipple.   When your baby's mouth is open wide enough, quickly bring your baby to your breast, placing your entire nipple and as much of the colored area around your nipple (areola) as possible into your baby's mouth.   More areola should be visible above your baby's upper lip than below the lower lip.   Your baby's tongue should be between his or her lower gum and your breast.   Ensure that your baby's mouth is correctly positioned around your nipple (latched). Your baby's lips should create a seal on your breast and be turned out (everted).  It is common for your baby to suck about 2-3 minutes in order to start the flow of breast milk. Latching Teaching your baby how to latch on to your breast properly is very important. An improper latch can cause nipple pain and decreased milk supply for you and poor weight gain in your baby. Also, if your baby is not latched onto your nipple properly, he or she may swallow some air during feeding. This can make your baby fussy. Burping your baby when you switch breasts during the feeding can help to get rid of the air. However, teaching your baby to latch on properly is still the best way to prevent fussiness from swallowing air while breastfeeding. Signs that your baby has successfully latched on to your nipple:    Silent tugging or silent sucking, without causing you pain.   Swallowing heard between every 3-4 sucks.    Muscle movement above and in front of his or her ears while sucking.  Signs that your baby has not successfully latched on to nipple:   Sucking sounds or smacking sounds from  your baby while breastfeeding.  Nipple pain. If you think your baby has not latched on correctly, slip your finger into the corner of your baby's mouth to break the suction and place it between your baby's gums. Attempt breastfeeding initiation again. Signs of Successful Breastfeeding Signs from your baby:   A gradual decrease in the number of sucks or complete cessation of sucking.   Falling asleep.   Relaxation of his or her body.   Retention of a small amount of milk in his or her mouth.   Letting go of  your breast by himself or herself. °Signs from you: °· Breasts that have increased in firmness, weight, and size 1-3 hours after feeding.   °· Breasts that are softer immediately after breastfeeding. °· Increased milk volume, as well as a change in milk consistency and color by the fifth day of breastfeeding.   °· Nipples that are not sore, cracked, or bleeding. °Signs That Your Baby is Getting Enough Milk °· Wetting at least 3 diapers in a 24-hour period. The urine should be clear and pale yellow by age 5 days. °· At least 3 stools in a 24-hour period by age 5 days. The stool should be soft and yellow. °· At least 3 stools in a 24-hour period by age 7 days. The stool should be seedy and yellow. °· No loss of weight greater than 10% of birth weight during the first 3 days of age. °· Average weight gain of 4-7 ounces (113-198 g) per week after age 4 days. °· Consistent daily weight gain by age 5 days, without weight loss after the age of 2 weeks. °After a feeding, your baby may spit up a small amount. This is common. °BREASTFEEDING FREQUENCY AND DURATION °Frequent feeding will help you make more milk and can prevent sore nipples and breast engorgement. Breastfeed when you feel the need to reduce the fullness of your breasts or when your baby shows signs of hunger. This is called "breastfeeding on demand." Avoid introducing a pacifier to your baby while you are working to establish breastfeeding  (the first 4-6 weeks after your baby is born). After this time you may choose to use a pacifier. Research has shown that pacifier use during the first year of a baby's life decreases the risk of sudden infant death syndrome (SIDS). °Allow your baby to feed on each breast as long as he or she wants. Breastfeed until your baby is finished feeding. When your baby unlatches or falls asleep while feeding from the first breast, offer the second breast. Because newborns are often sleepy in the first few weeks of life, you may need to awaken your baby to get him or her to feed. °Breastfeeding times will vary from baby to baby. However, the following rules can serve as a guide to help you ensure that your baby is properly fed: °· Newborns (babies 4 weeks of age or younger) may breastfeed every 1-3 hours. °· Newborns should not go longer than 3 hours during the day or 5 hours during the night without breastfeeding. °· You should breastfeed your baby a minimum of 8 times in a 24-hour period until you begin to introduce solid foods to your baby at around 6 months of age. °BREAST MILK PUMPING °Pumping and storing breast milk allows you to ensure that your baby is exclusively fed your breast milk, even at times when you are unable to breastfeed. This is especially important if you are going back to work while you are still breastfeeding or when you are not able to be present during feedings. Your lactation consultant can give you guidelines on how long it is safe to store breast milk.  °A breast pump is a machine that allows you to pump milk from your breast into a sterile bottle. The pumped breast milk can then be stored in a refrigerator or freezer. Some breast pumps are operated by hand, while others use electricity. Ask your lactation consultant which type will work best for you. Breast pumps can be purchased, but some hospitals and breastfeeding support groups lease   breast pumps on a monthly basis. A lactation consultant can  teach you how to hand express breast milk, if you prefer not to use a pump.  °CARING FOR YOUR BREASTS WHILE YOU BREASTFEED °Nipples can become dry, cracked, and sore while breastfeeding. The following recommendations can help keep your breasts moisturized and healthy: °· Avoid using soap on your nipples.   °· Wear a supportive bra. Although not required, special nursing bras and tank tops are designed to allow access to your breasts for breastfeeding without taking off your entire bra or top. Avoid wearing underwire-style bras or extremely tight bras. °· Air dry your nipples for 3-4 minutes after each feeding.   °· Use only cotton bra pads to absorb leaked breast milk. Leaking of breast milk between feedings is normal.   °· Use lanolin on your nipples after breastfeeding. Lanolin helps to maintain your skin's normal moisture barrier. If you use pure lanolin, you do not need to wash it off before feeding your baby again. Pure lanolin is not toxic to your baby. You may also hand express a few drops of breast milk and gently massage that milk into your nipples and allow the milk to air dry. °In the first few weeks after giving birth, some women experience extremely full breasts (engorgement). Engorgement can make your breasts feel heavy, warm, and tender to the touch. Engorgement peaks within 3-5 days after you give birth. The following recommendations can help ease engorgement: °· Completely empty your breasts while breastfeeding or pumping. You may want to start by applying warm, moist heat (in the shower or with warm water-soaked hand towels) just before feeding or pumping. This increases circulation and helps the milk flow. If your baby does not completely empty your breasts while breastfeeding, pump any extra milk after he or she is finished. °· Wear a snug bra (nursing or regular) or tank top for 1-2 days to signal your body to slightly decrease milk production. °· Apply ice packs to your breasts, unless this is  too uncomfortable for you. °· Make sure that your baby is latched on and positioned properly while breastfeeding. °If engorgement persists after 48 hours of following these recommendations, contact your health care provider or a lactation consultant. °OVERALL HEALTH CARE RECOMMENDATIONS WHILE BREASTFEEDING °· Eat healthy foods. Alternate between meals and snacks, eating 3 of each per day. Because what you eat affects your breast milk, some of the foods may make your baby more irritable than usual. Avoid eating these foods if you are sure that they are negatively affecting your baby. °· Drink milk, fruit juice, and water to satisfy your thirst (about 10 glasses a day).   °· Rest often, relax, and continue to take your prenatal vitamins to prevent fatigue, stress, and anemia. °· Continue breast self-awareness checks. °· Avoid chewing and smoking tobacco. °· Avoid alcohol and drug use. °Some medicines that may be harmful to your baby can pass through breast milk. It is important to ask your health care provider before taking any medicine, including all over-the-counter and prescription medicine as well as vitamin and herbal supplements. °It is possible to become pregnant while breastfeeding. If birth control is desired, ask your health care provider about options that will be safe for your baby. °SEEK MEDICAL CARE IF:  °· You feel like you want to stop breastfeeding or have become frustrated with breastfeeding. °· You have painful breasts or nipples. °· Your nipples are cracked or bleeding. °· Your breasts are red, tender, or warm. °· You have a   swollen area on either breast.  You have a fever or chills.  You have nausea or vomiting.  You have drainage other than breast milk from your nipples.  Your breasts do not become full before feedings by the fifth day after you give birth.  You feel sad and depressed.  Your baby is too sleepy to eat well.  Your baby is having trouble sleeping.   Your baby is  wetting less than 3 diapers in a 24-hour period.  Your baby has less than 3 stools in a 24-hour period.  Your baby's skin or the white part of his or her eyes becomes yellow.   Your baby is not gaining weight by 665 days of age. SEEK IMMEDIATE MEDICAL CARE IF:   Your baby is overly tired (lethargic) and does not want to wake up and feed.  Your baby develops an unexplained fever. Document Released: 06/23/2005 Document Revised: 06/28/2013 Document Reviewed: 12/15/2012 [facility] Patient Information 2015 [place], [place]. This information is not intended to replace advice given to you by your health care provider. Make sure you discuss any questions you have with your health care provider. Preventing Constipation After Surgery Constipation is when a person has fewer than 3 bowel movements a week; has difficulty having a bowel movement; or has stools that are dry, hard, or larger than normal. Many things can make constipation likely after surgery. They include:  Medicines, especially numbing medicines (anesthetics) and very strong pain medicines called narcotics.  Feeling stressed because of the surgery.  Eating different foods than normal.  Being less active. Symptoms of constipation include:  Having fewer than 3 bowel movements a week.  Straining to have a bowel movement.  Having hard, dry, or larger-than-normal stools.  Feeling full or bloated.  Having pain in the lower abdomen.  Not feeling relief after having a bowel movement. HOME CARE INSTRUCTIONS  Diet  Eat foods that have a lot of fiber. These include fruits, vegetables, whole grains, and beans. Limit foods high in fat and processed sugars. These include french fries, hamburgers, cookies, and candy.  Take a fiber supplement as directed. If you are not taking a fiber supplement and think that you are not getting enough fiber from foods, talk to your health care provider about adding a fiber supplement to your diet.  Drink  clear fluids, especially water. Avoid drinking alcohol, caffeine, and soda. These can make constipation worse.  Drink enough fluids to keep your urine clear or pale yellow. Activity   After surgery, return to your normal activities slowly or when your health care provider says it is okay.  Start walking as soon as you can. Try to go a little farther each day.  Once your health care provider approves, do some sort of regular exercise. This helps prevent constipation. Bowel Movements  Go to the restroom when you have the urge to go. Do not hold it in.  Try drinking something hot to get a bowel movement started.  Keep track of how often you use the restroom. If you miss 2-3 bowel movements, talk to your health care provider about medicines that prevent constipation. Your health care provider may suggest a stool softener, laxative, or fiber supplement.  Only take over-the-counter or prescription medicines as directed by your health care provider.  Do not take other medicines without talking to your health care provider first. If you become constipated and take a medicine to make you have a bowel movement, the problem may get worse. Other kinds  of medicine can also make the problem worse. SEEK MEDICAL CARE IF:  You used stool softeners or laxatives and still have not had a bowel movement within 24-48 hours after using them.  You have not had a bowel movement in 3 days. SEEK IMMEDIATE MEDICAL CARE IF:   Your constipation lasts for more than 4 days or gets worse.  You have bright red blood in your stool.  You have abdominal or rectal pain.  You have very bad cramping.  You have thin, pencil-like stools.  You have unexplained weight loss.  You have a fever or persistent symptoms for more than 2-3 days.  You have a fever and your symptoms suddenly get worse. Document Released: 10/18/2012 Document Revised: 11/07/2013 Document Reviewed: 10/18/2012 [facility] Patient Information 2015  [place], [place]. This information is not intended to replace advice given to you by your health care provider. Make sure you discuss any questions you have with your health care provider.

## 2014-05-26 ENCOUNTER — Encounter (HOSPITAL_COMMUNITY): Payer: Self-pay | Admitting: Obstetrics and Gynecology

## 2014-09-15 ENCOUNTER — Encounter: Payer: Self-pay | Admitting: *Deleted

## 2014-09-15 ENCOUNTER — Emergency Department (INDEPENDENT_AMBULATORY_CARE_PROVIDER_SITE_OTHER)
Admission: EM | Admit: 2014-09-15 | Discharge: 2014-09-15 | Disposition: A | Discharge status: Home / Self Care | Payer: BLUE CROSS/BLUE SHIELD | Source: Home / Self Care | Attending: Emergency Medicine | Admitting: Emergency Medicine

## 2014-09-15 DIAGNOSIS — J0100 Acute maxillary sinusitis, unspecified: Secondary | ICD-10-CM

## 2014-09-15 MED ORDER — AZITHROMYCIN 250 MG PO TABS: ORAL_TABLET | ORAL | Status: DC

## 2014-09-15 MED ORDER — FLUTICASONE PROPIONATE 50 MCG/ACT NA SUSP: NASAL | Status: DC

## 2014-09-15 NOTE — ED Notes (Signed)
Pt c/o nasal congestion, sinus pain and HA x 6 days. Denies fever. She has taken IBF.

## 2014-09-15 NOTE — ED Provider Notes (Signed)
CSN: [id number]     Arrival date & time 09/15/14  [id number] History   First MD Initiated Contact with Patient 09/15/14 [phone]     Chief Complaint  Patient presents with  . Nasal Congestion  . Facial Pain   (Consider location/radiation/quality/duration/timing/severity/associated sxs/prior Treatment) HPI SINUSITIS  Onset: 6 days Facial/sinus pressure with discolored nasal mucus.    Severity: moderate Tried OTC meds without significant relief.  Symptoms:  + Fever  + URI prodrome with nasal congestion + Minimal swollen neck glands + mild Sinus Headache + mild ear pressure  No Allergy symptoms No significant Sore Throat No eye symptoms     No significant Cough No chest pain No shortness of breath  No wheezing  No Abdominal Pain No Nausea No Vomiting No diarrhea  No Myalgias No focal neurologic symptoms No syncope No Rash  No Urinary symptoms   She denies chance of pregnancy. She has a healthy 95-month-old baby at home. Not breast-feeding.       Past Medical History  Diagnosis Date  . Termination of pregnancy (fetus) 1996, 1999    x 2 CONFIDENTIAL  . History of hiatal hernia   . Postpartum care following cesarean delivery (11/16) 05/22/2014   Past Surgical History  Procedure Laterality Date  . Cesarean section  09/07/2010    x 1  . Dilation and curettage of uterus    . Wisdom tooth extraction    . Cesarean section with bilateral tubal ligation N/A 05/22/2014    Procedure: CESAREAN SECTION WITH BILATERAL TUBAL LIGATION;  Surgeon: Serita Kyle, MD;  Location: [facility];  Service: Obstetrics;  Laterality: N/A;   Family History  Problem Relation Age of Onset  . Fibromyalgia Mother    History  Substance Use Topics  . Smoking status: Former Smoker -- 0.50 packs/day for 6 years    Quit date: 09/04/2013  . Smokeless tobacco: Never Used  . Alcohol Use: No   OB History    Gravida Para Term Preterm AB TAB SAB Ectopic Multiple Living   0 2       Obstetric Comments   TAB x 2 Confidential      Review of Systems  All other systems reviewed and are negative.   Allergies  Penicillins; Shellfish-derived products; and Latex  Home Medications   Prior to Admission medications   Medication Sig Start Date End Date Taking? Authorizing Provider  azithromycin (ZITHROMAX Z-PAK) 250 MG tablet Take 2 tablets on day one, then 1 tablet daily on days 2 through 5 09/15/14   Lajean Manes, MD  ferrous sulfate 325 (65 FE) MG tablet Take 1 tablet (325 mg total) by mouth 2 (two) times daily with a meal. 05/25/14   Raelyn Mora, CNM  fluticasone (FLONASE) 50 MCG/ACT nasal spray 1 or 2 sprays each nostril twice a day 09/15/14   Lajean Manes, MD   BP 110/71 mmHg  Pulse 81  Temp(Src) 97.6 F (36.4 C) (Oral)  Resp 16  Ht  (1.549 m)  Wt 113 lb (51.256 kg)  BMI 21.36 kg/m2  SpO2 100%  LMP 09/11/2014 Physical Exam  Constitutional: She is oriented to person, place, and time. She appears well-developed and well-nourished. No distress.  HENT:  Head: Normocephalic and atraumatic.  Right Ear: Tympanic membrane, external ear and ear canal normal.  Left Ear: Tympanic membrane, external ear and ear canal normal.  Nose: Mucosal edema and rhinorrhea present. Right sinus exhibits maxillary sinus tenderness. Left  sinus exhibits maxillary sinus tenderness.  Mouth/Throat: Oropharynx is clear and moist. No oral lesions. No oropharyngeal exudate.  Eyes: Right eye exhibits no discharge. Left eye exhibits no discharge. No scleral icterus.  Neck: Neck supple.  Cardiovascular: Normal rate, regular rhythm and normal heart sounds.   Pulmonary/Chest: Effort normal and breath sounds normal. She has no wheezes. She has no rales.  Lymphadenopathy:    She has no cervical adenopathy.  Neurological: She is alert and oriented to person, place, and time.  Skin: Skin is warm and dry.  Nursing note and vitals reviewed.   ED Course  Procedures (including critical  care time) Labs Review Labs Reviewed - No data to display  Imaging Review No results found.   MDM   1. Acute maxillary sinusitis, recurrence not specified    New Prescriptions   AZITHROMYCIN (ZITHROMAX Z-PAK) 250 MG TABLET    Take 2 tablets on day one, then 1 tablet daily on days 2 through 5   FLUTICASONE (FLONASE) 50 MCG/ACT NASAL SPRAY    1 or 2 sprays each nostril twice a day   Other symptomatic care discussed Follow-up with your primary care doctor in 5-7 days if not improving, or sooner if symptoms become worse. Precautions discussed. Red flags discussed. Questions invited and answered. Patient voiced understanding and agreement.     Lajean Manesavid Massey, MD 09/15/14 [phone]

## 2014-09-20 ENCOUNTER — Encounter: Payer: Self-pay | Admitting: *Deleted

## 2014-09-20 ENCOUNTER — Emergency Department (INDEPENDENT_AMBULATORY_CARE_PROVIDER_SITE_OTHER)
Admission: EM | Admit: 2014-09-20 | Discharge: 2014-09-20 | Disposition: A | Discharge status: Home / Self Care | Payer: BLUE CROSS/BLUE SHIELD | Source: Home / Self Care | Attending: Family Medicine | Admitting: Family Medicine

## 2014-09-20 DIAGNOSIS — H6692 Otitis media, unspecified, left ear: Secondary | ICD-10-CM | POA: Diagnosis not present

## 2014-09-20 DIAGNOSIS — H6592 Unspecified nonsuppurative otitis media, left ear: Secondary | ICD-10-CM

## 2014-09-20 DIAGNOSIS — H6983 Other specified disorders of Eustachian tube, bilateral: Secondary | ICD-10-CM | POA: Diagnosis not present

## 2014-09-20 MED ORDER — PREDNISONE 20 MG PO TABS: 20.0000 mg | ORAL_TABLET | Freq: Two times a day (BID) | ORAL | Status: DC

## 2014-09-20 NOTE — ED Provider Notes (Signed)
CSN: [id number]     Arrival date & time 09/20/14  [id number] History   First MD Initiated Contact with Patient 09/20/14 [phone]     Chief Complaint  Patient presents with  . Hearing Problem     HPI Comments: Patient was previously seen and treated for URI and sinusitis, prescribed azithromycin.  She complains of left ear being clogged with decreased hearing for about 5 days.  She still has mild cough and sore throat, but no fever, and feels well otherwise.  The history is provided by the patient.    Past Medical History  Diagnosis Date  . Termination of pregnancy (fetus) 1996, 1999    x 2 CONFIDENTIAL  . History of hiatal hernia   . Postpartum care following cesarean delivery (11/16) 05/22/2014   Past Surgical History  Procedure Laterality Date  . Cesarean section  09/07/2010    x 1  . Dilation and curettage of uterus    . Wisdom tooth extraction    . Cesarean section with bilateral tubal ligation N/A 05/22/2014    Procedure: CESAREAN SECTION WITH BILATERAL TUBAL LIGATION;  Surgeon: Serita KyleSheronette A Cousins, MD;  Location: [facility];  Service: Obstetrics;  Laterality: N/A;   Family History  Problem Relation Age of Onset  . Fibromyalgia Mother    History  Substance Use Topics  . Smoking status: Former Smoker -- 0.50 packs/day for 6 years    Quit date: 09/04/2013  . Smokeless tobacco: Never Used  . Alcohol Use: No   OB History    Gravida Para Term Preterm AB TAB SAB Ectopic Multiple Living   4 2 2  2 2    0 2      Obstetric Comments   TAB x 2 Confidential      Review of Systems + sore throat + cough No pleuritic pain No wheezing + nasal congestion + post-nasal drainage No sinus pain/pressure No itchy/red eyes + left earache No hemoptysis No SOB No fever/chills No nausea No vomiting No abdominal pain No diarrhea No urinary symptoms No skin rash No fatigue No myalgias No headache    Allergies  Penicillins; Shellfish-derived products; and Latex  Home Medications    Prior to Admission medications   Medication Sig Start Date End Date Taking? Authorizing Provider  ferrous sulfate 325 (65 FE) MG tablet Take 1 tablet (325 mg total) by mouth 2 (two) times daily with a meal. 05/25/14   Raelyn Moraolitta Dawson, CNM  fluticasone (FLONASE) 50 MCG/ACT nasal spray 1 or 2 sprays each nostril twice a day 09/15/14   Lajean Manesavid Massey, MD  predniSONE (DELTASONE) 20 MG tablet Take 1 tablet (20 mg total) by mouth 2 (two) times daily. Take with food. 09/20/14   Lattie HawStephen A Eunique Balik, MD   BP 121/77 mmHg  Pulse 86  Temp(Src) 97.6 F (36.4 C) (Oral)  Resp 16  Ht 5\' 1"  (1.549 m)  Wt 113 lb (51.256 kg)  BMI 21.36 kg/m2  SpO2 99%  LMP 09/11/2014 Physical Exam Nursing notes and Vital Signs reviewed. Appearance:  Patient appears stated age, and in no acute distress Eyes:  Pupils are equal, round, and reactive to light and accomodation.  Extraocular movement is intact.  Conjunctivae are not inflamed  Ears:  Canals normal.  Right tympanic membrane normal.  Left tympanic membrane has serous effusion bit no erythema or sign of inflammation Nose:  Mildly congested turbinates.  No sinus tenderness.  Marland Kitchen.  Pharynx:  Normal Neck:  Supple.  Tender enlarged posterior nodes are palpated bilaterally  Lungs:  Clear to auscultation.  Breath sounds are equal.  Heart:  Regular rate and rhythm without murmurs, rubs, or gallops.  Skin:  No rash present.   ED Course  Procedures  none    Tympanogram:   Left ear low peak height ("flat line"); Right ear negative pressure Imaging Review    MDM   1. Left otitis media with effusion; resolving viral URI   2. Eustachian tube dysfunction, bilateral    Begin prednisone burst. Take plain guaifenesin (  extended release tabs such as Mucinex) twice daily, with plenty of water, for cough and congestion.  Add Pseudoephedrine for sinus congestion.    May use Afrin nasal spray (or generic oxymetazoline) twice daily for about 5 days.  Also recommend using saline  nasal spray several times daily and saline nasal irrigation (AYR is a common brand).  Use Flonase nasal spray each morning after using Afrin nasal spray and saline nasal irrigation. Stop all antihistamines for now, and other non-prescription cough/cold preparations. May take Ibuprofen , 4 tabs every 8 hours with food for pain.   Follow-up with ENT physician if not improving 5 days.     Lattie Haw, MD 09/20/14 [phone]

## 2014-09-20 NOTE — Discharge Instructions (Signed)
Take plain guaifenesin (1200mg  extended release tabs such as Mucinex) twice daily, with plenty of water, for cough and congestion.  Add Pseudoephedrine for sinus congestion.    May use Afrin nasal spray (or generic oxymetazoline) twice daily for about 5 days.  Also recommend using saline nasal spray several times daily and saline nasal irrigation (AYR is a common brand).  Use Flonase nasal spray each morning after using Afrin nasal spray and saline nasal irrigation. Stop all antihistamines for now, and other non-prescription cough/cold preparations. May take Ibuprofen 200mg , 4 tabs every 8 hours with food for pain.   Follow-up with ENT physician if not improving 5 days.    Otitis Media Otitis media is redness, soreness, and inflammation of the middle ear. Otitis media may be caused by allergies or, most commonly, by infection. Often it occurs as a complication of the common cold. SIGNS AND SYMPTOMS Symptoms of otitis media may include:  Earache.  Fever.  Ringing in your ear.  Headache.  Leakage of fluid from the ear. DIAGNOSIS To diagnose otitis media, your health care provider will examine your ear with an otoscope. This is an instrument that allows your health care provider to see into your ear in order to examine your eardrum. Your health care provider also will ask you questions about your symptoms. TREATMENT  Typically, otitis media resolves on its own within 3-5 days. Your health care provider may prescribe medicine to ease your symptoms of pain. If otitis media does not resolve within 5 days or is recurrent, your health care provider may prescribe antibiotic medicines if he or she suspects that a bacterial infection is the cause. HOME CARE INSTRUCTIONS   If you were prescribed an antibiotic medicine, finish it all even if you start to feel better.  Take medicines only as directed by your health care provider.  Keep all follow-up visits as directed by your health care  provider. SEEK MEDICAL CARE IF:  You have otitis media only in one ear, or bleeding from your nose, or both.  You notice a lump on your neck.  You are not getting better in 3-5 days.  You feel worse instead of better. SEEK IMMEDIATE MEDICAL CARE IF:   You have pain that is not controlled with medicine.  You have swelling, redness, or pain around your ear or stiffness in your neck.  You notice that part of your face is paralyzed.  You notice that the bone behind your ear (mastoid) is tender when you touch it. MAKE SURE YOU:   Understand these instructions.  Will watch your condition.  Will get help right away if you are not doing well or get worse. Document Released: 03/28/2004 Document Revised: 11/07/2013 Document Reviewed: 01/18/2013 [facility] Patient Information 2015 [place], [place]. This information is not intended to replace advice given to you by your health care provider. Make sure you discuss any questions you have with your health care provider.

## 2014-09-20 NOTE — ED Notes (Signed)
Pt c/o decreased hearing in her LT ear that began after her visit on 09/15/14. She has completed all of her meds from her previous visit.

## 2014-10-09 ENCOUNTER — Emergency Department (INDEPENDENT_AMBULATORY_CARE_PROVIDER_SITE_OTHER)
Admission: EM | Admit: 2014-10-09 | Discharge: 2014-10-09 | Disposition: A | Discharge status: Home / Self Care | Payer: BLUE CROSS/BLUE SHIELD | Source: Home / Self Care | Attending: Family Medicine | Admitting: Family Medicine

## 2014-10-09 ENCOUNTER — Encounter: Payer: Self-pay | Admitting: Emergency Medicine

## 2014-10-09 DIAGNOSIS — L255 Unspecified contact dermatitis due to plants, except food: Secondary | ICD-10-CM

## 2014-10-09 MED ORDER — TRIAMCINOLONE ACETONIDE 40 MG/ML IJ SUSP
40.0000 mg | Freq: Once | INTRAMUSCULAR | Status: AC
  Administered 2014-10-09: 40 mg via INTRAMUSCULAR

## 2014-10-09 MED ORDER — DOMEBORO 25 % EX PACK: PACK | CUTANEOUS | Status: DC

## 2014-10-09 NOTE — ED Provider Notes (Signed)
CSN: [id number]     Arrival date & time 10/09/14  [id number] History   First MD Initiated Contact with Patient 10/09/14 1059     Chief Complaint  Patient presents with  . Poison Ivy     HPI Comments: Patient states that she was searching for Easter eggs in the grass about one week ago.  Five days ago she developed a pruritic rash on the dorsum of her hands.  The rash has had small weeping vesicles.  Patient is a 34 y.o. female presenting with rash. The history is provided by the patient.  Rash Location:  Hand Hand rash location:  Dorsum of L hand and dorsum of R hand Quality: blistering, itchiness, redness and weeping   Quality: not burning, not painful, not peeling and not swelling   Severity:  Mild Onset quality:  Sudden Duration:  5 days Timing:  Constant Progression:  Unchanged Chronicity:  New Context: plant contact   Relieved by:  Nothing Ineffective treatments: calamine lotion. Associated symptoms: no fatigue, no fever, no joint pain, no myalgias, no sore throat and no URI     Past Medical History  Diagnosis Date  . Termination of pregnancy (fetus) 1996, 1999    x 2 CONFIDENTIAL  . History of hiatal hernia   . Postpartum care following cesarean delivery (11/16) 05/22/2014   Past Surgical History  Procedure Laterality Date  . Cesarean section  09/07/2010    x 1  . Dilation and curettage of uterus    . Wisdom tooth extraction    . Cesarean section with bilateral tubal ligation N/A 05/22/2014    Procedure: CESAREAN SECTION WITH BILATERAL TUBAL LIGATION;  Surgeon: Serita KyleSheronette A Cousins, MD;  Location: [facility];  Service: Obstetrics;  Laterality: N/A;   Family History  Problem Relation Age of Onset  . Fibromyalgia Mother    History  Substance Use Topics  . Smoking status: Former Smoker -- 0.50 packs/day for 6 years    Quit date: 09/04/2013  . Smokeless tobacco: Never Used  . Alcohol Use: No   OB History    Gravida Para Term Preterm AB TAB SAB Ectopic Multiple Living   4 2  2  2 2    0 2      Obstetric Comments   TAB x 2 Confidential      Review of Systems  Constitutional: Negative for fever and fatigue.  HENT: Negative for sore throat.   Musculoskeletal: Negative for myalgias and arthralgias.  Skin: Positive for rash.  All other systems reviewed and are negative.   Allergies  Penicillins; Shellfish-derived products; and Latex  Home Medications   Prior to Admission medications   Medication Sig Start Date End Date Taking? Authorizing Provider  Alum Sulfate-Ca Acetate (DOMEBORO) 25 % PACK Dissolve in water and apply as a compress TID 10/09/14   Lattie HawStephen A Beese, MD  ferrous sulfate 325 (65 FE) MG tablet Take 1 tablet (325 mg total) by mouth 2 (two) times daily with a meal. 05/25/14   Raelyn Moraolitta Dawson, CNM  fluticasone (FLONASE) 50 MCG/ACT nasal spray 1 or 2 sprays each nostril twice a day 09/15/14   Lajean Manesavid Massey, MD  predniSONE (DELTASONE) 20 MG tablet Take 1 tablet (20 mg total) by mouth 2 (two) times daily. Take with food. 09/20/14   Lattie HawStephen A Beese, MD   BP 113/74 mmHg  Pulse 67  Temp(Src) 97.5 F (36.4 C) (Oral)  Ht 5\' 1"  (1.549 m)  Wt 115 lb (52.164 kg)  BMI 21.74 kg/m2  SpO2 100%  LMP 09/11/2014 Physical Exam  Constitutional: She is oriented to person, place, and time. She appears well-nourished. No distress.  HENT:  Head: Normocephalic.  Eyes: Conjunctivae are normal. Pupils are equal, round, and reactive to light.  Musculoskeletal:       Hands: The dorsa of both hands has typical rhus type dermatitis with slight oozing of clear fluid.  No warmth, tenderness, or swelling.  No lesions on palms or elsewhere on body.  Neurological: She is alert and oriented to person, place, and time.  Skin: Skin is warm and dry. Rash noted.  Nursing note and vitals reviewed.   ED Course  Procedures  none   MDM   1. Rhus dermatitis    Kenalog  IM Begin DomeBoro soaks. May take oral Benadryl at bedtime for itching. Followup with Family Doctor if  not improved in about 4 days.    Lattie Haw, MD 10/09/14 [phone]

## 2014-10-09 NOTE — Discharge Instructions (Signed)
May take oral Benadryl at bedtime for itching.   Poison [employer] Poison ivy is a inflammation of the skin (contact dermatitis) caused by touching the allergens on the leaves of the ivy plant following previous exposure to the plant. The rash usually appears 48 hours after exposure. The rash is usually bumps (papules) or blisters (vesicles) in a linear pattern. Depending on your own sensitivity, the rash may simply cause redness and itching, or it may also progress to blisters which may break open. These must be well cared for to prevent secondary bacterial (germ) infection, followed by scarring. Keep any open areas dry, clean, dressed, and covered with an antibacterial ointment if needed. The eyes may also get puffy. The puffiness is worst in the morning and gets better as the day progresses. This dermatitis usually heals without scarring, within 2 to 3 weeks without treatment. HOME CARE INSTRUCTIONS  Thoroughly wash with soap and water as soon as you have been exposed to poison ivy. You have about one half hour to remove the plant resin before it will cause the rash. This washing will destroy the oil or antigen on the skin that is causing, or will cause, the rash. Be sure to wash under your fingernails as any plant resin there will continue to spread the rash. Do not rub skin vigorously when washing affected area. Poison ivy cannot spread if no oil from the plant remains on your body. A rash that has progressed to weeping sores will not spread the rash unless you have not washed thoroughly. It is also important to wash any clothes you have been wearing as these may carry active allergens. The rash will return if you wear the unwashed clothing, even several days later. Avoidance of the plant in the future is the best measure. Poison ivy plant can be recognized by the number of leaves. Generally, poison ivy has three leaves with flowering branches on a single stem. Diphenhydramine may be purchased over the counter  and used as needed for itching. Do not drive with this medication if it makes you drowsy.Ask your caregiver about medication for children. SEEK MEDICAL CARE IF:  Open sores develop.  Redness spreads beyond area of rash.  You notice purulent (pus-like) discharge.  You have increased pain.  Other signs of infection develop (such as fever). Document Released: 06/20/2000 Document Revised: 09/15/2011 Document Reviewed: 12/01/2008 [facility] Patient Information 2015 [employer], [place]. This information is not intended to replace advice given to you by your health care provider. Make sure you discuss any questions you have with your health care provider.

## 2014-10-09 NOTE — ED Notes (Signed)
Poison Ivy on back of hands x 5 days

## 2016-11-19 DIAGNOSIS — G43909 Migraine, unspecified, not intractable, without status migrainosus: Secondary | ICD-10-CM

## 2016-11-19 HISTORY — DX: Migraine, unspecified, not intractable, without status migrainosus: G43.909

## 2016-11-20 DIAGNOSIS — E559 Vitamin D deficiency, unspecified: Secondary | ICD-10-CM

## 2016-11-20 HISTORY — DX: Vitamin D deficiency, unspecified: E55.9

## 2018-02-19 DIAGNOSIS — M533 Sacrococcygeal disorders, not elsewhere classified: Secondary | ICD-10-CM | POA: Insufficient documentation

## 2018-02-19 HISTORY — DX: Sacrococcygeal disorders, not elsewhere classified: M53.3

## 2021-08-05 DIAGNOSIS — J45901 Unspecified asthma with (acute) exacerbation: Secondary | ICD-10-CM | POA: Insufficient documentation

## 2021-12-30 DIAGNOSIS — R000 Tachycardia, unspecified: Secondary | ICD-10-CM | POA: Insufficient documentation

## 2021-12-31 ENCOUNTER — Other Ambulatory Visit: Payer: Self-pay

## 2021-12-31 DIAGNOSIS — M797 Fibromyalgia: Secondary | ICD-10-CM | POA: Insufficient documentation

## 2021-12-31 DIAGNOSIS — Z332 Encounter for elective termination of pregnancy: Secondary | ICD-10-CM | POA: Insufficient documentation

## 2021-12-31 DIAGNOSIS — R5383 Other fatigue: Secondary | ICD-10-CM | POA: Insufficient documentation

## 2021-12-31 DIAGNOSIS — Z8719 Personal history of other diseases of the digestive system: Secondary | ICD-10-CM | POA: Insufficient documentation

## 2021-12-31 DIAGNOSIS — R002 Palpitations: Secondary | ICD-10-CM | POA: Insufficient documentation

## 2022-01-17 ENCOUNTER — Encounter: Payer: Self-pay | Admitting: Cardiology

## 2022-01-17 ENCOUNTER — Telehealth (HOSPITAL_BASED_OUTPATIENT_CLINIC_OR_DEPARTMENT_OTHER): Payer: Self-pay

## 2022-01-17 ENCOUNTER — Ambulatory Visit: Payer: BC Managed Care – PPO | Admitting: Cardiology

## 2022-01-17 ENCOUNTER — Ambulatory Visit (INDEPENDENT_AMBULATORY_CARE_PROVIDER_SITE_OTHER): Payer: BC Managed Care – PPO

## 2022-01-17 VITALS — BP 112/75 | HR 90 | Ht 61.0 in | Wt 123.2 lb

## 2022-01-17 DIAGNOSIS — R002 Palpitations: Secondary | ICD-10-CM

## 2022-01-17 DIAGNOSIS — E782 Mixed hyperlipidemia: Secondary | ICD-10-CM

## 2022-01-17 DIAGNOSIS — Z87891 Personal history of nicotine dependence: Secondary | ICD-10-CM

## 2022-01-17 DIAGNOSIS — R011 Cardiac murmur, unspecified: Secondary | ICD-10-CM

## 2022-01-17 HISTORY — DX: Mixed hyperlipidemia: E78.2

## 2022-01-17 HISTORY — DX: Personal history of nicotine dependence: Z87.891

## 2022-01-17 HISTORY — DX: Cardiac murmur, unspecified: R01.1

## 2022-01-17 NOTE — Patient Instructions (Addendum)
Medication Instructions:  Your physician recommends that you continue on your current medications as directed. Please refer to the Current Medication list given to you today.  *If you need a refill on your cardiac medications before your next appointment, please call your pharmacy*   Lab Work: None ordered If you have labs (blood work) drawn today and your tests are completely normal, you will receive your results only by: MyChart Message (if you have MyChart) OR A paper copy in the mail If you have any lab test that is abnormal or we need to change your treatment, we will call you to review the results.   Testing/Procedures: Your physician has requested that you have an echocardiogram. Echocardiography is a painless test that uses sound waves to create images of your heart. It provides your doctor with information about the size and shape of your heart and how well your heart's chambers and valves are working. This procedure takes approximately one hour. There are no restrictions for this procedure.   WHY IS MY DOCTOR PRESCRIBING ZIO? The Zio system is proven and trusted by physicians to detect and diagnose irregular heart rhythms -- and has been prescribed to hundreds of thousands of patients.  The FDA has cleared the Zio system to monitor for many different kinds of irregular heart rhythms. In a study, physicians were able to reach a diagnosis 90% of the time with the Zio system1.  You can wear the Zio monitor -- a small, discreet, comfortable patch -- during your normal day-to-day activity, including while you sleep, shower, and exercise, while it records every single heartbeat for analysis.  1Barrett, P., et al. Comparison of 24 Hour Holter Monitoring Versus 14 Day Novel Adhesive Patch Electrocardiographic Monitoring. American Journal of Medicine, 2014.  ZIO VS. HOLTER MONITORING The Zio monitor can be comfortably worn for up to 14 days. Holter monitors can be worn for 24 to 48  hours, limiting the time to record any irregular heart rhythms you may have. Zio is able to capture data for the 51% of patients who have their first symptom-triggered arrhythmia after 48 hours.1  LIVE WITHOUT RESTRICTIONS The Zio ambulatory cardiac monitor is a small, unobtrusive, and water-resistant patch--you might even forget you're wearing it. The Zio monitor records and stores every beat of your heart, whether you're sleeping, working out, or showering.  Wear the monitor for 14 days, remove 01/31/22.   We will order CT coronary calcium score. It will cost $99.00 and is not covered by insurance.  Please call to schedule.    [facility] [address] [place], [place] [address] [phone]   Follow-Up: At [facility], you and your health needs are our priority.  As part of our continuing mission to provide you with exceptional heart care, we have created designated Provider Care Teams.  These Care Teams include your primary Cardiologist (physician) and Advanced Practice Providers (APPs -  Physician Assistants and Nurse Practitioners) who all work together to provide you with the care you need, when you need it.  We recommend signing up for the patient portal called "MyChart".  Sign up information is provided on this After Visit Summary.  MyChart is used to connect with patients for Virtual Visits (Telemedicine).  Patients are able to view lab/test results, encounter notes, upcoming appointments, etc.  Non-urgent messages can be sent to your provider as well.   To learn more about what you can do with MyChart, go to [email or web address].    Your next  appointment:   4 month(s)  The format for your next appointment:   In Person  Provider:   Belva Crome, MD   Other Instructions Echocardiogram An echocardiogram is a test that uses sound waves (ultrasound) to produce images of the heart. Images from an echocardiogram can provide important information  about: Heart size and shape. The size and thickness and movement of your heart's walls. Heart muscle function and strength. Heart valve function or if you have stenosis. Stenosis is when the heart valves are too narrow. If blood is flowing backward through the heart valves (regurgitation). A tumor or infectious growth around the heart valves. Areas of heart muscle that are not working well because of poor blood flow or injury from a heart attack. Aneurysm detection. An aneurysm is a weak or damaged part of an artery wall. The wall bulges out from the normal force of blood pumping through the body. Tell a health care provider about: Any allergies you have. All medicines you are taking, including vitamins, herbs, eye drops, creams, and over-the-counter medicines. Any blood disorders you have. Any surgeries you have had. Any medical conditions you have. Whether you are pregnant or may be pregnant. What are the risks? Generally, this is a safe test. However, problems may occur, including an allergic reaction to dye (contrast) that may be used during the test. What happens before the test? No specific preparation is needed. You may eat and drink normally. What happens during the test? You will take off your clothes from the waist up and put on a hospital gown. Electrodes or electrocardiogram (ECG)patches may be placed on your chest. The electrodes or patches are then connected to a device that monitors your heart rate and rhythm. You will lie down on a table for an ultrasound exam. A gel will be applied to your chest to help sound waves pass through your skin. A handheld device, called a transducer, will be pressed against your chest and moved over your heart. The transducer produces sound waves that travel to your heart and bounce back (or "echo" back) to the transducer. These sound waves will be captured in real-time and changed into images of your heart that can be viewed on a video monitor.  The images will be recorded on a computer and reviewed by your health care provider. You may be asked to change positions or hold your breath for a short time. This makes it easier to get different views or better views of your heart. In some cases, you may receive contrast through an IV in one of your veins. This can improve the quality of the pictures from your heart. The procedure may vary among health care providers and hospitals.   What can I expect after the test? You may return to your normal, everyday life, including diet, activities, and medicines, unless your health care provider tells you not to do that. Follow these instructions at home: It is up to you to get the results of your test. Ask your health care provider, or the department that is doing the test, when your results will be ready. Keep all follow-up visits. This is important. Summary An echocardiogram is a test that uses sound waves (ultrasound) to produce images of the heart. Images from an echocardiogram can provide important information about the size and shape of your heart, heart muscle function, heart valve function, and other possible heart problems. You do not need to do anything to prepare before this test. You may eat and drink  normally. After the echocardiogram is completed, you may return to your normal, everyday life, unless your health care provider tells you not to do that. This information is not intended to replace advice given to you by your health care provider. Make sure you discuss any questions you have with your health care provider. Document Revised: 02/14/2020 Document Reviewed: 02/14/2020 Elsevier Patient Education  2021 Elsevier Inc.  Coronary Calcium Scan A coronary calcium scan is an imaging test used to look for deposits of plaque in the inner lining of the blood vessels of the heart (coronary arteries). Plaque is made up of calcium, protein, and fatty substances. These deposits of plaque can  partly clog and narrow the coronary arteries without producing any symptoms or warning signs. This puts a person at risk for a heart attack. A coronary calcium scan is performed using a computed tomography (CT) scanner machine without using a dye (contrast). This test is recommended for people who are at moderate risk for heart disease. The test can find plaque deposits before symptoms develop. Tell a health care provider about: Any allergies you have. All medicines you are taking, including vitamins, herbs, eye drops, creams, and over-the-counter medicines. Any problems you or family members have had with anesthetic medicines. Any bleeding problems you have. Any surgeries you have had. Any medical conditions you have. Whether you are pregnant or may be pregnant. What are the risks? Generally, this is a safe procedure. However, problems may occur, including: Harm to a pregnant woman and her unborn baby. This test involves the use of radiation. Radiation exposure can be dangerous to a pregnant woman and her unborn baby. If you are pregnant or think you may be pregnant, you should not have this procedure done. A slight increase in the risk of cancer. This is because of the radiation involved in the test. The amount of radiation from one test is similar to the amount of radiation you are naturally exposed to over one year. What happens before the procedure? Ask your health care provider for any specific instructions on how to prepare for this procedure. You may be asked to avoid products that contain caffeine, tobacco, or nicotine for 4 hours before the procedure. What happens during the procedure?  You will undress and remove any jewelry from your neck or chest. You may need to remove hearing aides and dentures. Women may need to remove their bras. You will put on a hospital gown. Sticky electrodes will be placed on your chest. The electrodes will be connected to an electrocardiogram (ECG) machine  to record a tracing of the electrical activity of your heart. You will lie down on your back on a curved bed that is attached to the CT scanner. You may be given medicine to slow down your heart rate so that clear pictures can be created. You will be moved into the CT scanner, and the CT scanner will take pictures of your heart. During this time, you will be asked to lie still and hold your breath for 10-20 seconds at a time while each picture of your heart is being taken. The procedure may vary among health care providers and hospitals. What can I expect after the procedure? You can return to your normal activities. It is up to you to get the results of your procedure. Ask your health care provider, or the department that is doing the procedure, when your results will be ready. Summary A coronary calcium scan is an imaging test used to look  for deposits of plaque in the inner lining of the blood vessels of the heart. Plaque is made up of calcium, protein, and fatty substances. A coronary calcium scan is performed using a CT scanner machine without contrast. Generally, this is a safe procedure. Tell your health care provider if you are pregnant or may be pregnant. Ask your health care provider for any specific instructions on how to prepare for this procedure. You can return to your normal activities after the scan is done. This information is not intended to replace advice given to you by your health care provider. Make sure you discuss any questions you have with your health care provider. Document Revised: 06/02/2021 Document Reviewed: 06/02/2021 Elsevier Patient Education  2023 Elsevier Inc. Fat and Cholesterol Restricted Eating Plan Eating a diet that limits fat and cholesterol may help lower your risk for heart disease and other conditions. Your body needs fat and cholesterol for basic functions, but eating too much of these things can be harmful to your health. Your health care provider  may order lab tests to check your blood fat (lipid) and cholesterol levels. This helps your health care provider understand your risk for certain conditions and whether you need to make diet changes. Work with your health care provider or dietitian to make an eating plan that is right for you.  What are tips for following this plan? General guidelines If you are overweight, work with your health care provider to lose weight safely. Losing just 5-10% of your body weight can improve your overall health and help prevent diseases such as diabetes and heart disease. Avoid: Foods with added sugar. Fried foods. Foods that contain partially hydrogenated oils, including stick margarine, some tub margarines, cookies, crackers, and other baked goods. If you drink alcohol: Limit how much you have to: 0-1 drink a day for women who are not pregnant. 0-2 drinks a day for men. Know how much alcohol is in a drink. In the [place] one drink equals one 12 oz bottle of beer (355 mL), one 5 oz glass of wine (148 mL), or one 1 oz glass of hard liquor (44 mL). Reading food labels Check food labels for: Trans fats or partially hydrogenated oils. Avoid foods that contain these. High amounts of saturated fat. Choose foods that are low in saturated fat (less than 2 g). The amount of cholesterol in each serving. The amount of fiber in each serving. Choose foods with healthy fats, such as: Monounsaturated and polyunsaturated fats. These include olive and canola oil, flaxseeds, walnuts, almonds, and seeds. Omega-3 fats. These are found in foods such as salmon, mackerel, sardines, tuna, flaxseed oil, and ground flaxseeds. Choose grain products that have whole grains. Look for the word "whole" as the first word in the ingredient list. Cooking Cook foods using methods other than frying. Baking, boiling, grilling, and broiling are some healthy options. Eat more home-cooked food and less restaurant, buffet, and fast  food. Avoid cooking using saturated fats. Animal sources of saturated fats include meats, butter, and cream. Plant sources of saturated fats include palm oil, palm kernel oil, and coconut oil. Meal planning  At meals, imagine dividing your plate into fourths: Fill one-half of your plate with vegetables, green salads, and fruit. Fill one-fourth of your plate with whole grains. Fill one-fourth of your plate with lean protein foods. Eat fish that is high in omega-3 fats at least two times a week. Eat more foods that contain fiber, such as whole grains, beans, apples, pears,  berries, broccoli, carrots, peas, and barley. These foods help promote healthy cholesterol levels in the blood. What foods should I eat? Fruits All fresh, canned (in natural juice), or frozen fruits. Vegetables Fresh or frozen vegetables (raw, steamed, roasted, or grilled). Green salads. Grains Whole grains, such as whole wheat or whole grain breads, crackers, cereals, and pasta. Unsweetened oatmeal, bulgur, barley, quinoa, or brown rice. Corn or whole wheat flour tortillas. Meats and other proteins Ground beef (85% or leaner), grass-fed beef, or beef trimmed of fat. Skinless chicken or [place]. Ground chicken or [place]. Pork trimmed of fat. All fish and seafood. Egg whites. Dried beans, peas, or lentils. Unsalted nuts or seeds. Unsalted canned beans. Natural nut butters without added sugar and oil. Dairy Low-fat or nonfat dairy products, such as skim or 1% milk, 2% or reduced-fat cheeses, low-fat and fat-free ricotta or cottage cheese, or plain low-fat and nonfat yogurt. Fats and oils Tub margarine without trans fats. Light or reduced-fat mayonnaise and salad dressings. Avocado. Olive, canola, sesame, or safflower oils. The items listed above may not be a complete list of foods and beverages you can eat. Contact a dietitian for more information. What foods should I avoid? Fruits Canned fruit in heavy syrup. Fruit in cream  or butter sauce. Fried fruit. Vegetables Vegetables cooked in cheese, cream, or butter sauce. Fried vegetables. Grains White bread. White pasta. White rice. Cornbread. Bagels, pastries, and croissants. Crackers and snack foods that contain trans fat and hydrogenated oils. Meats and other proteins Fatty cuts of meat. Ribs, chicken wings, bacon, sausage, bologna, salami, chitterlings, fatback, hot dogs, bratwurst, and packaged lunch meats. Liver and organ meats. Whole eggs and egg yolks. Chicken and [place] with skin. Fried meat. Dairy Whole or 2% milk, cream, half-and-half, and cream cheese. Whole milk cheeses. Whole-fat or sweetened yogurt. Full-fat cheeses. Nondairy creamers and whipped toppings. Processed cheese, cheese spreads, and cheese curds. Fats and oils Butter, stick margarine, lard, shortening, ghee, or bacon fat. Coconut, palm kernel, and palm oils. Beverages Alcohol. Sugar-sweetened drinks such as sodas, lemonade, and fruit drinks. Sweets and desserts Corn syrup, sugars, honey, and molasses. Candy. Jam and jelly. Syrup. Sweetened cereals. Cookies, pies, cakes, donuts, muffins, and ice cream. The items listed above may not be a complete list of foods and beverages you should avoid. Contact a dietitian for more information. Summary Your body needs fat and cholesterol for basic functions. However, eating too much of these things can be harmful to your health. Work with your health care provider and dietitian to follow a diet that limits fat and cholesterol. Doing this may help lower your risk for heart disease and other conditions. Choose healthy fats, such as monounsaturated and polyunsaturated fats, and foods high in omega-3 fatty acids. Eat fiber-rich foods, such as whole grains, beans, peas, fruits, and vegetables. Limit or avoid alcohol, fried foods, and foods high in saturated fats, partially hydrogenated oils, and sugar. This information is not intended to replace advice given to  you by your health care provider. Make sure you discuss any questions you have with your health care provider. Document Revised: 11/02/2020 Document Reviewed: 11/02/2020 Elsevier Patient Education  2023 [employer].

## 2022-01-17 NOTE — Progress Notes (Signed)
Cardiology Office Note:    Date:  01/17/2022   ID:  Tina [name], DOB Oct 11, 1980, MRN [id number]  PCP:  Tina Inch, MD  Cardiologist:  Tina Brothers, MD   Referring MD: Tina Done., MD    ASSESSMENT:    1. Palpitations   2. Mixed hyperlipidemia   3. Ex-smoker   4. Cardiac murmur    PLAN:    In order of problems listed above:  Primary prevention stressed with the patient.  Importance of compliance with diet and medication stressed and she vocalized understanding.  She was advised to walk on a regular basis starting with 15 minutes and gradually increase it up. Cardiac murmur and echocardiogram will be done to assess murmur heard on auscultation. Palpitations: Reports and blood work reviewed from primary care.  TSH was normal.  We will do a 2-week monitor. Risk stratification for coronary artery disease as she has history of mixed dyslipidemia and smoking: I recommended CT calcium scoring and she is agreeable.  This will also guide lipid therapy. Dyslipidemia diet was emphasized.  Lifestyle modification urged diet sheet was given evidence of hyperlipidemia emphasized and she understands and promises to do better. Patient will be seen in follow-up appointment in 6 months or earlier if the patient has any concerns    Medication Adjustments/Labs and Tests Ordered: Current medicines are reviewed at length with the patient today.  Concerns regarding medicines are outlined above.  No orders of the defined types were placed in this encounter.  No orders of the defined types were placed in this encounter.    History of Present Illness:    Tina [name] is a 41 y.o. female who is being seen today for the evaluation of palpitations at the request of [place], Excell Seltzer., MD. patient is a pleasant 41 year old female.  She has no significant past medical history.  She is a former smoker quit in 2018.  She mentions to me that in the past she has had COVID she denies any  chest pain orthopnea or PND.  Her concern is about increasing heart rate normal activities of daily living.  She is concerned about it.  At the time of my evaluation, the patient is alert awake oriented and in no distress.  No dizziness or any passing out spells.  Past Medical History:  Diagnosis Date   Coccydynia 02/19/2018   Fibromyalgia    History of hiatal hernia    Migraines 11/19/2016   Other fatigue    Palpitations    Postpartum care following cesarean delivery (11/16) 05/22/2014   Termination of pregnancy (fetus) 1996, 1999   x 2 CONFIDENTIAL   Vitamin D deficiency 11/20/2016    Past Surgical History:  Procedure Laterality Date   CESAREAN SECTION  09/07/2010   x 1   CESAREAN SECTION WITH BILATERAL TUBAL LIGATION N/A 05/22/2014   Procedure: CESAREAN SECTION WITH BILATERAL TUBAL LIGATION;  Surgeon: Serita Kyle, MD;  Location: [facility];  Service: Obstetrics;  Laterality: N/A;   DILATION AND CURETTAGE OF UTERUS     WISDOM TOOTH EXTRACTION      Current Medications: Current Meds  Medication Sig   albuterol (VENTOLIN HFA) 108 (90 Base) MCG/ACT inhaler Inhale 2 puffs into the lungs daily.   fluticasone (FLONASE) 50 MCG/ACT nasal spray 1 or 2 sprays each nostril twice a day   LORazepam (ATIVAN) 1 MG tablet Take 1 mg by mouth 3 (three) times daily.     Allergies:   Penicillins, Shellfish-derived  products, Duloxetine, and Latex   Social History   Socioeconomic History   Marital status: Married    Spouse name: Not on file   Number of children: Not on file   Years of education: Not on file   Highest education level: Not on file  Occupational History   Not on file  Tobacco Use   Smoking status: Former    Packs/day: 0.50    Years: 6.00    Total pack years: 3.00    Types: Cigarettes    Quit date: 09/04/2013    Years since quitting: 8.3   Smokeless tobacco: Never  Substance and Sexual Activity   Alcohol use: No   Drug use: No   Sexual activity: Yes    Birth  control/protection: None  Other Topics Concern   Not on file  Social History Narrative   Not on file   Social Determinants of Health   Financial Resource Strain: Not on file  Food Insecurity: Not on file  Transportation Needs: Not on file  Physical Activity: Not on file  Stress: Not on file  Social Connections: Not on file     Family History: The patient's family history includes Breast cancer in her maternal grandfather and paternal grandmother; Diabetes Mellitus II in her paternal grandmother; Fibromyalgia in her mother; Osteoporosis in her mother.  ROS:   Please see the history of present illness.    All other systems reviewed and are negative.  EKGs/Labs/Other Studies Reviewed:    The following studies were reviewed today: I discussed my findings with the patient at length EKG reveals sinus rhythm and nonspecific ST-T changes   Recent Labs: No results found for requested labs within last 365 days.  Recent Lipid Panel No results found for: "CHOL", "TRIG", "HDL", "CHOLHDL", "VLDL", "LDLCALC", "LDLDIRECT"  Physical Exam:    VS:  BP 112/75   Pulse 90   Ht 5\' 1"  (1.549 m)   Wt 123 lb 3.2 oz (55.9 kg)   SpO2 99%   BMI 23.28 kg/m     Wt Readings from Last 3 Encounters:  01/17/22 123 lb 3.2 oz (55.9 kg)  10/09/14 115 lb (52.2 kg)  09/20/14 113 lb (51.3 kg)     GEN: Patient is in no acute distress HEENT: Normal NECK: No JVD; No carotid bruits LYMPHATICS: No lymphadenopathy CARDIAC: S1 S2 regular, 2/6 systolic murmur at the apex. RESPIRATORY:  Clear to auscultation without rales, wheezing or rhonchi  ABDOMEN: Soft, non-tender, non-distended MUSCULOSKELETAL:  No edema; No deformity  SKIN: Warm and dry NEUROLOGIC:  Alert and oriented x 3 PSYCHIATRIC:  Normal affect    Signed, 09/22/14, MD  01/17/2022 4:10 PM    [facility]

## 2022-01-30 ENCOUNTER — Ambulatory Visit (INDEPENDENT_AMBULATORY_CARE_PROVIDER_SITE_OTHER): Payer: BC Managed Care – PPO

## 2022-01-30 DIAGNOSIS — R011 Cardiac murmur, unspecified: Secondary | ICD-10-CM

## 2022-01-31 LAB — ECHOCARDIOGRAM COMPLETE
Area-P 1/2: 5.13 cm2
S' Lateral: 2.6 cm

## 2022-02-07 ENCOUNTER — Telehealth: Payer: Self-pay

## 2022-02-07 ENCOUNTER — Telehealth: Payer: Self-pay | Admitting: Cardiology

## 2022-02-07 ENCOUNTER — Ambulatory Visit (HOSPITAL_BASED_OUTPATIENT_CLINIC_OR_DEPARTMENT_OTHER)
Admission: RE | Admit: 2022-02-07 | Discharge: 2022-02-07 | Disposition: A | Discharge status: Home / Self Care | Payer: BC Managed Care – PPO | Source: Ambulatory Visit | Attending: Cardiology | Admitting: Cardiology

## 2022-02-07 ENCOUNTER — Encounter: Payer: Self-pay | Admitting: Cardiology

## 2022-02-07 DIAGNOSIS — E782 Mixed hyperlipidemia: Secondary | ICD-10-CM | POA: Insufficient documentation

## 2022-02-07 NOTE — Telephone Encounter (Signed)
Calling with a call report  ?

## 2022-02-07 NOTE — Telephone Encounter (Signed)
Radiology from [facility] called to make Dr. Tomie China aware of Addendum to CT report. Reviewed by Dr. Bing Matter. Recommended that PCP follow up on findings.

## 2022-02-08 ENCOUNTER — Other Ambulatory Visit (HOSPITAL_BASED_OUTPATIENT_CLINIC_OR_DEPARTMENT_OTHER): Payer: BC Managed Care – PPO

## 2022-02-10 ENCOUNTER — Telehealth: Payer: Self-pay

## 2022-02-10 DIAGNOSIS — R931 Abnormal findings on diagnostic imaging of heart and coronary circulation: Secondary | ICD-10-CM

## 2022-02-10 DIAGNOSIS — E782 Mixed hyperlipidemia: Secondary | ICD-10-CM

## 2022-02-10 NOTE — Telephone Encounter (Signed)
Spoke with pt about CT results. Advised per Dr. Tomie China to come in for lab work. Test results sent to PCP and lab req sent to lab.

## 2022-02-15 LAB — HEPATIC FUNCTION PANEL
ALT: 25 IU/L (ref 0–32)
AST: 18 IU/L (ref 0–40)
Albumin: 4.4 g/dL (ref 3.9–4.9)
Alkaline Phosphatase: 92 IU/L (ref 44–121)
Bilirubin Total: 0.3 mg/dL (ref 0.0–1.2)
Bilirubin, Direct: <0.1 mg/dL (ref 0.00–0.40)
Total Protein: 7 g/dL (ref 6.0–8.5)

## 2022-02-15 LAB — BASIC METABOLIC PANEL
BUN/Creatinine Ratio: 11 (ref 9–23)
BUN: 8 mg/dL (ref 6–24)
CO2: 23 mmol/L (ref 20–29)
Calcium: 9.5 mg/dL (ref 8.7–10.2)
Chloride: 103 mmol/L (ref 96–106)
Creatinine, Ser: 0.74 mg/dL (ref 0.57–1.00)
Glucose: 97 mg/dL (ref 70–99)
Potassium: 4.1 mmol/L (ref 3.5–5.2)
Sodium: 140 mmol/L (ref 134–144)
eGFR: 104 mL/min/{1.73_m2} (ref >59)

## 2022-02-15 LAB — LIPID PANEL
Chol/HDL Ratio: 4.6 ratio — ABNORMAL HIGH (ref 0.0–4.4)
Cholesterol, Total: 210 mg/dL — ABNORMAL HIGH (ref 100–199)
HDL: 46 mg/dL (ref >39)
LDL Chol Calc (NIH): 140 mg/dL — ABNORMAL HIGH (ref 0–99)
Triglycerides: 133 mg/dL (ref 0–149)
VLDL Cholesterol Cal: 24 mg/dL (ref 5–40)

## 2022-02-17 MED ORDER — ROSUVASTATIN CALCIUM 10 MG PO TABS: 10.0000 mg | ORAL_TABLET | Freq: Every day | ORAL | 3 refills | Status: DC

## 2022-02-17 NOTE — Addendum Note (Signed)
Addended by: Eleonore Chiquito on: 02/17/2022 03:32 PM   Modules accepted: Orders

## 2022-02-17 NOTE — Addendum Note (Signed)
Addended by: Eleonore Chiquito on: 02/17/2022 03:29 PM   Modules accepted: Orders

## 2022-03-25 LAB — LIPID PANEL
Chol/HDL Ratio: 3.2 ratio (ref 0.0–4.4)
Cholesterol, Total: 143 mg/dL (ref 100–199)
HDL: 45 mg/dL (ref >39)
LDL Chol Calc (NIH): 83 mg/dL (ref 0–99)
Triglycerides: 79 mg/dL (ref 0–149)
VLDL Cholesterol Cal: 15 mg/dL (ref 5–40)

## 2022-03-25 LAB — HEPATIC FUNCTION PANEL
ALT: 18 IU/L (ref 0–32)
AST: 17 IU/L (ref 0–40)
Albumin: 4.7 g/dL (ref 3.9–4.9)
Alkaline Phosphatase: 82 IU/L (ref 44–121)
Bilirubin Total: 0.3 mg/dL (ref 0.0–1.2)
Bilirubin, Direct: 0.11 mg/dL (ref 0.00–0.40)
Total Protein: 7 g/dL (ref 6.0–8.5)

## 2022-04-28 ENCOUNTER — Encounter: Payer: Self-pay | Admitting: Cardiology

## 2022-05-19 ENCOUNTER — Other Ambulatory Visit (HOSPITAL_BASED_OUTPATIENT_CLINIC_OR_DEPARTMENT_OTHER): Payer: Self-pay | Admitting: Specialist

## 2022-05-19 DIAGNOSIS — Z87891 Personal history of nicotine dependence: Secondary | ICD-10-CM

## 2022-05-20 ENCOUNTER — Ambulatory Visit: Payer: BC Managed Care – PPO | Admitting: Cardiology

## 2022-05-21 ENCOUNTER — Telehealth (HOSPITAL_BASED_OUTPATIENT_CLINIC_OR_DEPARTMENT_OTHER): Payer: Self-pay

## 2022-05-23 ENCOUNTER — Ambulatory Visit (HOSPITAL_BASED_OUTPATIENT_CLINIC_OR_DEPARTMENT_OTHER): Admission: RE | Admit: 2022-05-23 | Payer: BC Managed Care – PPO | Source: Ambulatory Visit

## 2022-05-26 ENCOUNTER — Other Ambulatory Visit: Payer: Self-pay

## 2022-05-27 ENCOUNTER — Encounter: Payer: Self-pay | Admitting: Cardiology

## 2022-05-27 ENCOUNTER — Ambulatory Visit: Payer: BC Managed Care – PPO | Attending: Cardiology | Admitting: Cardiology

## 2022-05-27 VITALS — BP 114/74 | HR 80 | Ht 61.0 in | Wt 120.0 lb

## 2022-05-27 DIAGNOSIS — R931 Abnormal findings on diagnostic imaging of heart and coronary circulation: Secondary | ICD-10-CM

## 2022-05-27 DIAGNOSIS — E782 Mixed hyperlipidemia: Secondary | ICD-10-CM | POA: Diagnosis not present

## 2022-05-27 HISTORY — DX: Abnormal findings on diagnostic imaging of heart and coronary circulation: R93.1

## 2022-05-27 NOTE — Progress Notes (Signed)
Cardiology Office Note:    Date:  05/27/2022   ID:  Tina [name], DOB 1981-05-23, MRN [id number]  PCP:  Nonnie Done., MD  Cardiologist:  Garwin Brothers, MD   Referring MD: Eartha Inch, MD    ASSESSMENT:    1. Elevated coronary artery calcium score   2. Mixed hyperlipidemia    PLAN:    In order of problems listed above:  Elevated calcium score: I discussed my findings with the patient at length.  Secondary prevention stressed.  Importance of compliance with diet medication stressed and she vocalized understanding. Mixed dyslipidemia: On lipid-lowering medications and doing well with diet.  Lipids have improved significantly.  Her LDL must be less than 60 and I discussed this with her.  This will be followed by primary care. Patient will be seen in follow-up appointment in 12 months or earlier if the patient has any concerns    Medication Adjustments/Labs and Tests Ordered: Current medicines are reviewed at length with the patient today.  Concerns regarding medicines are outlined above.  No orders of the defined types were placed in this encounter.  No orders of the defined types were placed in this encounter.    No chief complaint on file.    History of Present Illness:    Tina [name] is a 41 y.o. female.  Patient has past medical history of elevated calcium score and mixed dyslipidemia.  She also has pulmonary issues which for which she is followed by primary care.  She denies any chest pain orthopnea or PND.  At the time of my evaluation, the patient is alert awake oriented and in no distress.  She is exercising on a regular basis.  This is limited by her pulmonary issues.  Past Medical History:  Diagnosis Date   Cardiac murmur 01/17/2022   Coccydynia 02/19/2018   Ex-smoker 01/17/2022   Fibromyalgia    History of hiatal hernia    Migraines 11/19/2016   Mixed hyperlipidemia 01/17/2022   Other fatigue    Palpitations    Postpartum care  following cesarean delivery (11/16) 05/22/2014   Termination of pregnancy (fetus) 1996, 1999   x 2 CONFIDENTIAL   Vitamin D deficiency 11/20/2016    Past Surgical History:  Procedure Laterality Date   CESAREAN SECTION  09/07/2010   x 1   CESAREAN SECTION WITH BILATERAL TUBAL LIGATION N/A 05/22/2014   Procedure: CESAREAN SECTION WITH BILATERAL TUBAL LIGATION;  Surgeon: Serita Kyle, MD;  Location: [facility];  Service: Obstetrics;  Laterality: N/A;   DILATION AND CURETTAGE OF UTERUS     WISDOM TOOTH EXTRACTION      Current Medications: Current Meds  Medication Sig   albuterol (VENTOLIN HFA) 108 (90 Base) MCG/ACT inhaler Inhale 2 puffs into the lungs as needed for wheezing or shortness of breath.   LORazepam (ATIVAN) 0.5 MG tablet Take 1 mg by mouth 3 (three) times daily.   montelukast (SINGULAIR) 10 MG tablet Take 10 mg by mouth daily.   rosuvastatin (CRESTOR) 10 MG tablet Take 1 tablet (10 mg total) by mouth daily.   SYMBICORT 80-4.5 MCG/ACT inhaler Inhale 2 puffs into the lungs 2 (two) times daily.     Allergies:   Penicillins, Shellfish-derived products, Duloxetine, and Latex   Social History   Socioeconomic History   Marital status: Married    Spouse name: Not on file   Number of children: Not on file   Years of education: Not on file   Highest education  level: Not on file  Occupational History   Not on file  Tobacco Use   Smoking status: Former    Packs/day: 0.50    Years: 6.00    Total pack years: 3.00    Types: Cigarettes    Quit date: 09/04/2013    Years since quitting: 8.7   Smokeless tobacco: Never  Substance and Sexual Activity   Alcohol use: No   Drug use: No   Sexual activity: Yes    Birth control/protection: None  Other Topics Concern   Not on file  Social History Narrative   Not on file   Social Determinants of Health   Financial Resource Strain: Not on file  Food Insecurity: Not on file  Transportation Needs: Not on file  Physical Activity:  Not on file  Stress: Not on file  Social Connections: Not on file     Family History: The patient's family history includes Breast cancer in her maternal grandfather and paternal grandmother; Diabetes Mellitus II in her paternal grandmother; Fibromyalgia in her mother; Osteoporosis in her mother.  ROS:   Please see the history of present illness.    All other systems reviewed and are negative.  EKGs/Labs/Other Studies Reviewed:    The following studies were reviewed today: I discussed my findings with the patient at length.   Recent Labs: 02/14/2022: BUN 8; Creatinine, Ser 0.74; Potassium 4.1; Sodium 140 03/25/2022: ALT 18  Recent Lipid Panel    Component Value Date/Time   CHOL 143 03/25/2022 0938   TRIG 79 03/25/2022 0938   HDL 45 03/25/2022 0938   CHOLHDL 3.2 03/25/2022 0938   LDLCALC 83 03/25/2022 0938    Physical Exam:    VS:  BP 114/74   Pulse 80   Ht 5\' 1"  (1.549 m)   Wt 120 lb (54.4 kg)   SpO2 97%   BMI 22.67 kg/m     Wt Readings from Last 3 Encounters:  05/27/22 120 lb (54.4 kg)  01/17/22 123 lb 3.2 oz (55.9 kg)  10/09/14 115 lb (52.2 kg)     GEN: Patient is in no acute distress HEENT: Normal NECK: No JVD; No carotid bruits LYMPHATICS: No lymphadenopathy CARDIAC: Hear sounds regular, 2/6 systolic murmur at the apex. RESPIRATORY:  Clear to auscultation without rales, wheezing or rhonchi  ABDOMEN: Soft, non-tender, non-distended MUSCULOSKELETAL:  No edema; No deformity  SKIN: Warm and dry NEUROLOGIC:  Alert and oriented x 3 PSYCHIATRIC:  Normal affect   Signed, 12/09/14, MD  05/27/2022 4:21 PM     [facility]

## 2022-05-27 NOTE — Patient Instructions (Signed)

## 2022-07-04 ENCOUNTER — Ambulatory Visit (HOSPITAL_BASED_OUTPATIENT_CLINIC_OR_DEPARTMENT_OTHER): Payer: BC Managed Care – PPO

## 2022-07-11 ENCOUNTER — Ambulatory Visit (HOSPITAL_BASED_OUTPATIENT_CLINIC_OR_DEPARTMENT_OTHER): Payer: BC Managed Care – PPO

## 2023-02-20 ENCOUNTER — Ambulatory Visit: Payer: BC Managed Care – PPO | Attending: Cardiology | Admitting: Cardiology

## 2023-02-20 ENCOUNTER — Encounter: Payer: Self-pay | Admitting: Cardiology

## 2023-02-20 VITALS — BP 102/68 | HR 82 | Ht 61.0 in | Wt 114.8 lb

## 2023-02-20 DIAGNOSIS — Z131 Encounter for screening for diabetes mellitus: Secondary | ICD-10-CM

## 2023-02-20 DIAGNOSIS — E559 Vitamin D deficiency, unspecified: Secondary | ICD-10-CM

## 2023-02-20 DIAGNOSIS — E782 Mixed hyperlipidemia: Secondary | ICD-10-CM | POA: Diagnosis not present

## 2023-02-20 DIAGNOSIS — R002 Palpitations: Secondary | ICD-10-CM

## 2023-02-20 DIAGNOSIS — R931 Abnormal findings on diagnostic imaging of heart and coronary circulation: Secondary | ICD-10-CM | POA: Diagnosis not present

## 2023-02-20 NOTE — Progress Notes (Signed)
Cardiology Office Note:    Date:  02/20/2023   ID:  Tina [name], DOB 1981-03-20, MRN [id number]  PCP:  Nonnie Done., MD  Cardiologist:  Garwin Brothers, MD   Referring MD: Nonnie Done., MD    ASSESSMENT:    1. Elevated coronary artery calcium score   2. Mixed hyperlipidemia    PLAN:    In order of problems listed above:  Elevated calcium score: Secondary prevention stressed to the patient.  Importance of compliance with diet medication stressed and she vocalized understanding.  She is walking more than half a mile on a daily basis without any problems and happy about it. Mixed dyslipidemia: On lipid-lowering medications.  Lipids are at goal.  She has not had blood work done for some time and she has been advised to come fasting in the next few days for the same.  Will also do hemoglobin A1c and vitamin D level in view of her history of deficiency. Patient will be seen in follow-up appointment in 12 months or earlier if the patient has any concerns.    Medication Adjustments/Labs and Tests Ordered: Current medicines are reviewed at length with the patient today.  Concerns regarding medicines are outlined above.  Orders Placed This Encounter  Procedures   EKG 12-Lead   No orders of the defined types were placed in this encounter.    No chief complaint on file.    History of Present Illness:    Tina [name] is a 42 y.o. female.  Patient has past medical history of elevated calcium score, history of smoking in the past she is quit now and mixed dyslipidemia.  She has had pulmonary issues.  Tells me that she is recovering significantly.  No chest pain orthopnea or PND.  She is walking on a regular basis.  She is here for follow-up.  At the time of my evaluation, the patient is alert awake oriented and in no distress.  Past Medical History:  Diagnosis Date   Acute severe exacerbation of allergic asthma 08/05/2021   Cardiac murmur 01/17/2022   Coccydynia  02/19/2018   Elevated coronary artery calcium score 05/27/2022   Ex-smoker 01/17/2022   Fibromyalgia    History of hiatal hernia    Increased heart rate 12/30/2021   Migraines 11/19/2016   Mixed hyperlipidemia 01/17/2022   Other fatigue    Palpitations    Postpartum care following cesarean delivery (11/16) 05/22/2014   Termination of pregnancy (fetus) 1996, 1999   x 2 CONFIDENTIAL   Vitamin D deficiency 11/20/2016    Past Surgical History:  Procedure Laterality Date   CESAREAN SECTION  09/07/2010   x 1   CESAREAN SECTION WITH BILATERAL TUBAL LIGATION N/A 05/22/2014   Procedure: CESAREAN SECTION WITH BILATERAL TUBAL LIGATION;  Surgeon: Serita Kyle, MD;  Location: [facility];  Service: Obstetrics;  Laterality: N/A;   DILATION AND CURETTAGE OF UTERUS     WISDOM TOOTH EXTRACTION      Current Medications: Current Meds  Medication Sig   cetirizine (ZYRTEC) 10 MG tablet Take 10 mg by mouth daily.   LORazepam (ATIVAN) 0.5 MG tablet Take 1 mg by mouth 3 (three) times daily.   montelukast (SINGULAIR) 10 MG tablet Take 10 mg by mouth daily.   rosuvastatin (CRESTOR) 10 MG tablet Take 10 mg by mouth daily.   SYMBICORT 160-4.5 MCG/ACT inhaler Inhale 2 puffs into the lungs 2 (two) times daily.     Allergies:   Penicillins, Shellfish-derived products,  Duloxetine, Iodine, and Latex   Social History   Socioeconomic History   Marital status: Married    Spouse name: Not on file   Number of children: Not on file   Years of education: Not on file   Highest education level: Not on file  Occupational History   Not on file  Tobacco Use   Smoking status: Former    Current packs/day: 0.00    Average packs/day: 0.5 packs/day for 6.0 years (3.0 ttl pk-yrs)    Types: Cigarettes    Start date: 09/05/2007    Quit date: 09/04/2013    Years since quitting: 9.4   Smokeless tobacco: Never  Substance and Sexual Activity   Alcohol use: No   Drug use: No   Sexual activity: Yes    Birth  control/protection: None  Other Topics Concern   Not on file  Social History Narrative   Not on file   Social Determinants of Health   Financial Resource Strain: Not on file  Food Insecurity: Not on file  Transportation Needs: Not on file  Physical Activity: Not on file  Stress: Not on file  Social Connections: Not on file     Family History: The patient's family history includes Breast cancer in her maternal grandfather and paternal grandmother; Diabetes Mellitus II in her paternal grandmother; Fibromyalgia in her mother; Osteoporosis in her mother.  ROS:   Please see the history of present illness.    All other systems reviewed and are negative.  EKGs/Labs/Other Studies Reviewed:    The following studies were reviewed today: .Marland KitchenEKG Interpretation Date/Time:  Friday February 20 2023 13:11:16 EDT Ventricular Rate:  82 PR Interval:  122 QRS Duration:  80 QT Interval:  356 QTC Calculation: 415 R Axis:   78  Text Interpretation: Normal sinus rhythm with sinus arrhythmia Normal ECG No previous ECGs available Confirmed by Belva Crome [phone]) on 02/20/2023 1:19:40 PM    Recent Labs: 03/25/2022: ALT 18  Recent Lipid Panel    Component Value Date/Time   CHOL 143 03/25/2022 0938   TRIG 79 03/25/2022 0938   HDL 45 03/25/2022 0938   CHOLHDL 3.2 03/25/2022 0938   LDLCALC 83 03/25/2022 0938    Physical Exam:    VS:  BP 102/68   Pulse 82   Ht 5\' 1"  (1.549 m)   Wt 114 lb 12.8 oz (52.1 kg)   SpO2 97%   BMI 21.69 kg/m     Wt Readings from Last 3 Encounters:  02/20/23 114 lb 12.8 oz (52.1 kg)  05/27/22 120 lb (54.4 kg)  01/17/22 123 lb 3.2 oz (55.9 kg)     GEN: Patient is in no acute distress HEENT: Normal NECK: No JVD; No carotid bruits LYMPHATICS: No lymphadenopathy CARDIAC: Hear sounds regular, 2/6 systolic murmur at the apex. RESPIRATORY:  Clear to auscultation without rales, wheezing or rhonchi  ABDOMEN: Soft, non-tender, non-distended MUSCULOSKELETAL:  No  edema; No deformity  SKIN: Warm and dry NEUROLOGIC:  Alert and oriented x 3 PSYCHIATRIC:  Normal affect   Signed, Garwin Brothers, MD  02/20/2023 1:20 PM    [facility]

## 2023-02-20 NOTE — Patient Instructions (Signed)
Medication Instructions:  Your physician recommends that you continue on your current medications as directed. Please refer to the Current Medication list given to you today.  *If you need a refill on your cardiac medications before your next appointment, please call your pharmacy*   Lab Work: Your physician recommends that you return for lab work in: the next few days for fasting lipids, CMP, CBC, TSH, A1C and vitamin D. You need to have labs done when you are fasting.  You can come Monday through Friday 8:30 am to 12:00 pm and 1:15 to 4:30. You do not need to make an appointment as the order has already been placed.   If you have labs (blood work) drawn today and your tests are completely normal, you will receive your results only by: MyChart Message (if you have MyChart) OR A paper copy in the mail If you have any lab test that is abnormal or we need to change your treatment, we will call you to review the results.   Testing/Procedures: None ordered   Follow-Up: At [facility], you and your health needs are our priority.  As part of our continuing mission to provide you with exceptional heart care, we have created designated Provider Care Teams.  These Care Teams include your primary Cardiologist (physician) and Advanced Practice Providers (APPs -  Physician Assistants and Nurse Practitioners) who all work together to provide you with the care you need, when you need it.  We recommend signing up for the patient portal called "MyChart".  Sign up information is provided on this After Visit Summary.  MyChart is used to connect with patients for Virtual Visits (Telemedicine).  Patients are able to view lab/test results, encounter notes, upcoming appointments, etc.  Non-urgent messages can be sent to your provider as well.   To learn more about what you can do with MyChart, go to [email or web address].    Your next appointment:   12 month(s)  The format for your next  appointment:   In Person  Provider:   Belva Crome, MD    Other Instructions none  Important Information About Sugar

## 2023-03-14 LAB — CBC
Hematocrit: 39 % (ref 34.0–46.6)
Hemoglobin: 13.5 g/dL (ref 11.1–15.9)
MCH: 31.4 pg (ref 26.6–33.0)
MCHC: 34.6 g/dL (ref 31.5–35.7)
MCV: 91 fL (ref 79–97)
Platelets: 219 10*3/uL (ref 150–450)
RBC: 4.3 x10E6/uL (ref 3.77–5.28)
RDW: 11.9 % (ref 11.7–15.4)
WBC: 6.7 10*3/uL (ref 3.4–10.8)

## 2023-03-14 LAB — LIPID PANEL
Chol/HDL Ratio: 2.7 ratio (ref 0.0–4.4)
Cholesterol, Total: 145 mg/dL (ref 100–199)
HDL: 53 mg/dL (ref >39)
LDL Chol Calc (NIH): 84 mg/dL (ref 0–99)
Triglycerides: 33 mg/dL (ref 0–149)
VLDL Cholesterol Cal: 8 mg/dL (ref 5–40)

## 2023-03-14 LAB — COMPREHENSIVE METABOLIC PANEL
ALT: 24 IU/L (ref 0–32)
AST: 18 IU/L (ref 0–40)
Albumin: 4.3 g/dL (ref 3.9–4.9)
Alkaline Phosphatase: 81 IU/L (ref 44–121)
BUN/Creatinine Ratio: 11 (ref 9–23)
BUN: 9 mg/dL (ref 6–24)
Bilirubin Total: 0.3 mg/dL (ref 0.0–1.2)
CO2: 25 mmol/L (ref 20–29)
Calcium: 9.3 mg/dL (ref 8.7–10.2)
Chloride: 105 mmol/L (ref 96–106)
Creatinine, Ser: 0.8 mg/dL (ref 0.57–1.00)
Globulin, Total: 2.5 g/dL (ref 1.5–4.5)
Glucose: 102 mg/dL — ABNORMAL HIGH (ref 70–99)
Potassium: 4 mmol/L (ref 3.5–5.2)
Sodium: 140 mmol/L (ref 134–144)
Total Protein: 6.8 g/dL (ref 6.0–8.5)
eGFR: 94 mL/min/{1.73_m2} (ref >59)

## 2023-03-14 LAB — HEMOGLOBIN A1C
Est. average glucose Bld gHb Est-mCnc: 114 mg/dL
Hgb A1c MFr Bld: 5.6 % (ref 4.8–5.6)

## 2023-03-14 LAB — TSH: TSH: 1.68 u[IU]/mL (ref 0.450–4.500)

## 2023-03-14 LAB — VITAMIN D 25 HYDROXY (VIT D DEFICIENCY, FRACTURES): Vit D, 25-Hydroxy: 27.4 ng/mL — ABNORMAL LOW (ref 30.0–100.0)

## 2023-05-01 ENCOUNTER — Other Ambulatory Visit: Payer: Self-pay

## 2023-05-01 MED ORDER — ROSUVASTATIN CALCIUM 10 MG PO TABS: 10.0000 mg | ORAL_TABLET | Freq: Every day | ORAL | 2 refills | Status: DC

## 2023-09-11 ENCOUNTER — Ambulatory Visit: Payer: BC Managed Care – PPO | Attending: Cardiology | Admitting: Cardiology

## 2023-09-11 ENCOUNTER — Encounter: Payer: Self-pay | Admitting: Cardiology

## 2023-09-11 VITALS — BP 114/70 | HR 88 | Ht 61.0 in | Wt 117.6 lb

## 2023-09-11 DIAGNOSIS — R002 Palpitations: Secondary | ICD-10-CM | POA: Diagnosis not present

## 2023-09-11 DIAGNOSIS — E782 Mixed hyperlipidemia: Secondary | ICD-10-CM | POA: Diagnosis not present

## 2023-09-11 DIAGNOSIS — R931 Abnormal findings on diagnostic imaging of heart and coronary circulation: Secondary | ICD-10-CM | POA: Diagnosis not present

## 2023-09-11 NOTE — Patient Instructions (Signed)

## 2023-09-11 NOTE — Progress Notes (Signed)
 Cardiology Office Note:    Date:  09/11/2023   ID:  Tina [name], DOB [birth year], MRN [id number]  PCP:  Nonnie Done., MD  Cardiologist:  Garwin Brothers, MD   Referring MD: Nonnie Done., MD    ASSESSMENT:    1. Elevated coronary artery calcium score   2. Mixed hyperlipidemia   3. Palpitations    PLAN:    In order of problems listed above:  Elevated calcium score: Secondary prevention stressed with patient.  Importance of compliance with diet medication stressed and patient vocalized understanding and questions were answered to her satisfaction.  She was advised to walk at least half an hour a day 5 days a week and she promises to do so. Mixed dyslipidemia: Lipid-lowering medications.  She will have blood work done in the next few days and send [place] a copy. Palpitations: She has had no such symptoms and is happy about it.  These have resolved. Patient will be seen in follow-up appointment in 12 months or earlier if the patient has any concerns.    Medication Adjustments/Labs and Tests Ordered: Current medicines are reviewed at length with the patient today.  Concerns regarding medicines are outlined above.  No orders of the defined types were placed in this encounter.  No orders of the defined types were placed in this encounter.    No chief complaint on file.    History of Present Illness:    Tina [name] is a 43 y.o. female.  Patient has past medical history of elevated calcium score, mixed dyslipidemia and palpitations.  She denies any problems at this time and takes care of activities of daily living.  No chest pain orthopnea or PND.  She exercises on a regular basis.  She has not had blood work for the past several months.  She is planning to undergo evaluation for blood work and physical in the next few weeks.  At the time of my evaluation, the patient is alert awake oriented and in no distress.  Past Medical History:  Diagnosis Date   Acute severe  exacerbation of allergic asthma 08/05/2021   Cardiac murmur 01/17/2022   Coccydynia 02/19/2018   Elevated coronary artery calcium score 05/27/2022   Ex-smoker 01/17/2022   Fibromyalgia    History of hiatal hernia    Increased heart rate 12/30/2021   Migraines 11/19/2016   Mixed hyperlipidemia 01/17/2022   Other fatigue    Palpitations    Postpartum care following cesarean delivery (11/16) 05/22/2014   Termination of pregnancy (fetus) 1996, 1999   x 2 CONFIDENTIAL   Vitamin D deficiency 11/20/2016    Past Surgical History:  Procedure Laterality Date   CESAREAN SECTION  09/07/2010   x 1   CESAREAN SECTION WITH BILATERAL TUBAL LIGATION N/A 05/22/2014   Procedure: CESAREAN SECTION WITH BILATERAL TUBAL LIGATION;  Surgeon: Serita Kyle, MD;  Location: [facility];  Service: Obstetrics;  Laterality: N/A;   DILATION AND CURETTAGE OF UTERUS     WISDOM TOOTH EXTRACTION      Current Medications: Current Meds  Medication Sig   cetirizine (ZYRTEC) 10 MG tablet Take 10 mg by mouth daily.   LORazepam (ATIVAN) 0.5 MG tablet Take 1 mg by mouth 3 (three) times daily.   montelukast (SINGULAIR) 10 MG tablet Take 10 mg by mouth daily.   rosuvastatin (CRESTOR) 10 MG tablet Take 1 tablet (10 mg total) by mouth daily.   SYMBICORT 160-4.5 MCG/ACT inhaler Inhale 2 puffs into the lungs  2 (two) times daily.     Allergies:   Penicillins, Shellfish-derived products, Duloxetine, Iodine, and Latex   Social History   Socioeconomic History   Marital status: Married    Spouse name: Not on file   Number of children: Not on file   Years of education: Not on file   Highest education level: Not on file  Occupational History   Not on file  Tobacco Use   Smoking status: Former    Current packs/day: 0.00    Average packs/day: 0.5 packs/day for 6.0 years (3.0 ttl pk-yrs)    Types: Cigarettes    Start date: 09/05/2007    Quit date: 09/04/2013    Years since quitting: 10.0   Smokeless tobacco: Never   Substance and Sexual Activity   Alcohol use: No   Drug use: No   Sexual activity: Yes    Birth control/protection: None  Other Topics Concern   Not on file  Social History Narrative   Not on file   Social Drivers of Health   Financial Resource Strain: Not on file  Food Insecurity: Not on file  Transportation Needs: Not on file  Physical Activity: Not on file  Stress: Not on file  Social Connections: Not on file     Family History: The patient's family history includes Breast cancer in her maternal grandfather and paternal grandmother; Diabetes Mellitus II in her paternal grandmother; Fibromyalgia in her mother; Osteoporosis in her mother.  ROS:   Please see the history of present illness.    All other systems reviewed and are negative.  EKGs/Labs/Other Studies Reviewed:    The following studies were reviewed today: I discussed my findings with the patient at length   Recent Labs: 03/13/2023: ALT 24; BUN 9; Creatinine, Ser 0.80; Hemoglobin 13.5; Platelets 219; Potassium 4.0; Sodium 140; TSH 1.680  Recent Lipid Panel    Component Value Date/Time   CHOL 145 03/13/2023 0818   TRIG 33 03/13/2023 0818   HDL 53 03/13/2023 0818   CHOLHDL 2.7 03/13/2023 0818   LDLCALC 84 03/13/2023 0818    Physical Exam:    VS:  BP 114/70   Pulse 88   Ht 5\' 1"  (1.549 m)   Wt 117 lb 9.6 oz (53.3 kg)   SpO2 97%   BMI 22.22 kg/m     Wt Readings from Last 3 Encounters:  09/11/23 117 lb 9.6 oz (53.3 kg)  02/20/23 114 lb 12.8 oz (52.1 kg)  05/27/22 120 lb (54.4 kg)     GEN: Patient is in no acute distress HEENT: Normal NECK: No JVD; No carotid bruits LYMPHATICS: No lymphadenopathy CARDIAC: Hear sounds regular, 2/6 systolic murmur at the apex. RESPIRATORY:  Clear to auscultation without rales, wheezing or rhonchi  ABDOMEN: Soft, non-tender, non-distended MUSCULOSKELETAL:  No edema; No deformity  SKIN: Warm and dry NEUROLOGIC:  Alert and oriented x 3 PSYCHIATRIC:  Normal affect    Signed, Garwin Brothers, MD  09/11/2023 11:59 AM    [facility]

## 2023-10-20 LAB — LAB REPORT - SCANNED
A1c: 5.3
EGFR: 112
TSH: 1.62 (ref 0.41–5.90)

## 2024-01-13 ENCOUNTER — Other Ambulatory Visit: Payer: Self-pay | Admitting: Cardiology

## 2024-03-30 ENCOUNTER — Other Ambulatory Visit: Payer: Self-pay

## 2024-03-31 LAB — SURGICAL PATHOLOGY

## 2024-05-31 ENCOUNTER — Other Ambulatory Visit: Payer: Self-pay | Admitting: Specialist

## 2024-05-31 DIAGNOSIS — R918 Other nonspecific abnormal finding of lung field: Secondary | ICD-10-CM

## 2024-06-24 ENCOUNTER — Inpatient Hospital Stay: Admission: RE | Admit: 2024-06-24 | Discharge: 2024-06-24 | Attending: Specialist | Admitting: Specialist

## 2024-06-24 DIAGNOSIS — R918 Other nonspecific abnormal finding of lung field: Secondary | ICD-10-CM

## 2024-09-14 ENCOUNTER — Ambulatory Visit: Admitting: Cardiology
